# Patient Record
Sex: Male | Born: 1945
Health system: Southern US, Community
[De-identification: ages and names within clinical notes are randomized; demographics above are authoritative.]

## PROBLEM LIST (undated history)

## (undated) DIAGNOSIS — I723 Aneurysm of iliac artery: Secondary | ICD-10-CM

## (undated) DIAGNOSIS — I10 Essential (primary) hypertension: Secondary | ICD-10-CM

## (undated) DIAGNOSIS — M1990 Unspecified osteoarthritis, unspecified site: Secondary | ICD-10-CM

## (undated) HISTORY — DX: Aneurysm of iliac artery: I72.3

## (undated) HISTORY — PX: WISDOM TOOTH EXTRACTION: SHX21

## (undated) HISTORY — PX: JOINT REPLACEMENT: SHX530

## (undated) HISTORY — PX: HAND SURGERY: SHX662

---

## 2000-07-16 ENCOUNTER — Ambulatory Visit (HOSPITAL_COMMUNITY): Admission: RE | Admit: 2000-07-16 | Discharge: 2000-07-16 | Payer: Self-pay | Admitting: Family Medicine

## 2000-09-11 ENCOUNTER — Ambulatory Visit (HOSPITAL_COMMUNITY): Admission: RE | Admit: 2000-09-11 | Discharge: 2000-09-11 | Payer: Self-pay | Admitting: Gastroenterology

## 2007-01-04 ENCOUNTER — Inpatient Hospital Stay (HOSPITAL_COMMUNITY): Admission: RE | Admit: 2007-01-04 | Discharge: 2007-01-06 | Payer: Self-pay | Admitting: Orthopedic Surgery

## 2007-09-27 ENCOUNTER — Inpatient Hospital Stay (HOSPITAL_COMMUNITY): Admission: RE | Admit: 2007-09-27 | Discharge: 2007-09-29 | Payer: Self-pay | Admitting: Orthopedic Surgery

## 2010-12-03 NOTE — H&P (Signed)
Michael Osborne, Michael Osborne                 ACCOUNT NO.:  [id number]   MEDICAL RECORD NO.:  [id number]          PATIENT TYPE:  INP   LOCATION:  NA                           FACILITY:  [facility]   PHYSICIAN:  Richardean Canal, P.A.    DATE OF BIRTH:  01-Jan-1946   DATE OF ADMISSION:  01/04/2007  DATE OF DISCHARGE:                              HISTORY & PHYSICAL   CHIEF COMPLAINT:  Left knee pain.   HISTORY OF PRESENT ILLNESS:  Michael Osborne is a 65 year old male with left  knee pain.  This began became progressively worse over the past few  years.  History of left knee scope June 19, 2000 by Dr. Rennis Chris.  The  patient now with painful popping, giving way sensation in knee.  He uses  no assistive devices to ambulate.  Does use a knee brace.  He has waking  pain.  Left knee pain without radiation described as throbbing pain with  some sharp pains.  He has had cortisone injections in the past with no  real relief.   ALLERGIES:  NO KNOWN DRUG ALLERGIES.   MEDICATIONS:  1. Benicar 40 mg one t.i.d. q.a.m.  2. Caduet 5/20 mg one p.o. q.a.m.  3. Metoprolol 25 mg one p.o. q.a.m.   PAST MEDICAL HISTORY:  Positive for hypertension, history of gout,  remote history of left shoulder dislocation.   PAST SURGICAL HISTORY:  1. March 19, 1995 right knee scope.  2. Dec 11, 1995 right knee.  3. June 19, 2000 left knee scope.  No complications of the above      procedures.  Also noted the patient had ORIF right thumb. No blood      transfusions with the above procedures.   SOCIAL HISTORY:  Patient has a remote history of smoking, quit smoking  in 1997.  He smoked for a total of 25 years.  He lives in a [place]  home with two steps to the regular entrance.   FAMILY HISTORY:  The patient's mother, age 79, has a history of MI,  otherwise healthy.  Father living, is age 43, has history of heart  disease, diabetes, strokes.  Has one brother age 65, has  hypercholesterolemia.   REVIEW OF SYSTEMS:  Reveals  the patient wears glasses for reading.  He  has hypertension, history of gout, remote history of the left shoulder  dislocation. Stress test 2001-1002, the patient relates as negative.  History of hemorrhoids.  Otherwise, review of systems negative or  noncontributory.   PHYSICAL EXAM:  GENERAL:  The patient is well-developed, well-nourished  male with obvious varus deformity, bow-legged bilaterally.  Walks with  varus gait pattern.  The patient's mood and affect are appropriate.  Talks easily with examiner.  VITAL SIGNS:  Temperature of 98.8, pulse 68, blood pressure 110/78 and  respiratory rate 16.  CARDIAC:  Regular rate and rhythm.  No murmurs, rubs or gallops noted.  CHEST:  Lungs clear to auscultation bilaterally with no wheezing,  rhonchi or rales noted.  HEENT:  Head is normocephalic, atraumatic without frontal maxillary  sinus tenderness to palpation.  Conjunctivae pink.  Sclerae is  nonicteric, PERRLA.  EOMs are intact.  No visible external ear  deformities.  TMs pearly and gray bilaterally.  NOSE:  Nasal septum midline.  Nasal mucosa pink and moist.  Buccal  mucosa is pink, moist with good dentition.  Pharynx without erythema or  exudate.  Tongue and uvula midline.  NECK:  Trachea is midline, no lymphadenopathy.  Carotids 2+ without  bruits.  No tenderness to palpation along the cervical spine.  He has  full range of motion of the cervical spine.  BACK:  No tenderness to palpation along the thoracic lumbar spine.  BREASTS/GENITOURINARY/RECTAL:  Exams all deferred at this time.  NEUROLOGIC:  The patient is alert and oriented x3.  Cranial nerves II-  XII grossly intact.  Lower extremity strength testing 5/5 throughout.  MUSCULOSKELETAL:  Upper extremities.  He has full range of motion of the  upper extremities without pain; however, strength is equal and symmetric  in size and shape.  Radial pulse 2+.  LOWER EXTREMITIES:  He has pain with range of motion of both hips.  LEFT  KNEE:  Positive crepitus, has range of motion with varus deformity  of 5 degrees, no effusion, full extension, flexes to 110 degrees.  Valgus varus stressing reveals no laxity.  RIGHT KNEE:  Positive crepitus with passive range of motion, varus  deformity 2-3 degrees.  Full extension, flexes to 115 degrees.  No  effusion, no edema.  Has tenderness in the medial joint line  bilaterally.  Valgus varus stressing both knees reveals no laxity.   X-RAYS:  X-rays of his left knee shows complete collapse of the medial  compartment with evidence of tri-compartmental osteoarthritis.   IMPRESSION:  1. Severe osteoarthritis, both knees with varus deformities.  2. Hypertension.  3. History of gout.  4. Remote history of left shoulders dislocation.   PLAN:  The patient to be admitted to [facility] on January 03, 2005 to undergo a left total knee arthroplasty.  Prior to surgery, the  patient did receive clearance from Dr. Jarome Matin of [facility].  The patient will undergo all preoperative lab  testing prior to surgery.      Richardean Canal, P.A.     GC/MEDQ  D:  12/31/2006  T:  01/01/2007  Job:  [id number]   cc:   Mila Homer. Sherlean Foot, M.D.

## 2010-12-03 NOTE — Op Note (Signed)
Michael Osborne, Michael Osborne                 ACCOUNT NO.:  [id number]   MEDICAL RECORD NO.:  [id number]          PATIENT TYPE:  INP   LOCATION:  5021                         FACILITY:  [facility]   PHYSICIAN:  Mila Homer. Sherlean Foot, M.D. DATE OF BIRTH:  02/28/1946   DATE OF PROCEDURE:  09/27/2007  DATE OF DISCHARGE:                               OPERATIVE REPORT   SURGEON:  Mila Homer. Sherlean Foot, M.D.   ASSISTANTArlys John D. Petrarca, P.A.-C.   PREOPERATIVE DIAGNOSIS:  Right knee osteoarthritis.   POSTOPERATIVE DIAGNOSIS:  Right knee osteoarthritis.   PROCEDURE:  A right total knee arthroplasty.   INDICATIONS FOR PROCEDURE:  The patient is 65 year old white male with  failure conservative measures for osteoarthritis of the right knee.  Left knee was done a year ago or nine months ago.  Informed consent  obtained.   DESCRIPTION OF PROCEDURE:  The patient was laid supine, administered  general anesthesia and placed in the supine position.  Foley catheter  was placed.  Right leg was prepped and draped in usual sterile fashion.  The extremity was exsanguinated with the Esmarch and tourniquet inflated  to 350 mmHg and set for an hour.  I made a midline incision with a #10  blade.  I used a new blade to make a median parapatellar arthrotomy to  perform a synovectomy.  I then elevated deep MCL off the medial crest of  the tibia and released some medially because he was in pretty  significant varus position preoperatively.  Then I everted the patella.  It measured 25 mm thick.  I reamed down 9 mm, drilled three lug holes  through 32-mm template.  With the trial in place I recreated 25-mm  thickness.  I then removed trial and went into flexion.  I used the  extramedullary system on the tibia to make a perpendicular cut to the  anatomic axis of the tibia.  I then used the intramedullary system on  the femur to make a 6 degree valgus cut on the distal femur done with  sagittal saw through the guide.  Then I  marked out the epicondylar  axis,posterior condylar angle measured about degrees.  I sized to size  F, pinned to 3 degree external placed holes.  I then placed the 4 in 1  cutting block onto the distal femur.  I made the anterior-posterior and  chamfer cuts with sagittal saw.  I then placed the 12-mm spacer block in  the knee.  It was a varus position so I recut in 2 degrees valgus and  corrected that.  I then placed a lamina spreader in the knee, removed  the ACL, PCL, and medial and lateral menisci and posterior condylar  osteophytes.  Then I used the finishing block for a size F on the femur  and tibial tray drilled and keeled to a size 5.  I then trialed with a  size 5 tibia, size F femur, 12 and 14 polys, felt that 14 insert worked  better, that I would have to do a little bit more medial releasing to  obtain  balance.  Patella tracked well.  I removed trial components and  copiously irrigated.  I then cemented in the components and removed  excess cement and allowed the cement to harden in extension.  I placed a  Hemovac coming out superolaterally and deep to the arthrotomy, pain  catheter coming out supermedial and superficial to arthrotomy.  I let  tourniquet down at 58 minutes and obtained hemostasis, copiously  irrigated again.  I then closed the arthrotomy with figure-of-eight #1  Vicryl sutures, deep soft tissues buried 0 Vicryl sutures and a  subcuticular 2-0 Vicryl stitch and skin staples.  Dressed with Xeroform  dressing sponges one layer of Webril and TED stocking.   COMPLICATIONS:  None.   DRAINS:  One Hemovac, one pain catheter.   ESTIMATED BLOOD LOSS:  300 mL.   TOURNIQUET TIME:  58 minutes.           ______________________________  Mila Homer Sherlean Foot, M.D.     SDL/MEDQ  D:  09/27/2007  T:  09/28/2007  Job:  [id number]

## 2010-12-03 NOTE — Op Note (Signed)
Michael, Osborne                 ACCOUNT NO.:  [id number]   MEDICAL RECORD NO.:  [id number]          PATIENT TYPE:  INP   LOCATION:  2861                         FACILITY:  [facility]   PHYSICIAN:  Mila Homer. Sherlean Foot, M.D. DATE OF BIRTH:  [birth year]   DATE OF PROCEDURE:  01/04/2007  DATE OF DISCHARGE:                               OPERATIVE REPORT   SURGEON:  Mila Homer. Sherlean Foot, M.D.   ASSISTANT:  Rexene Edison, PA.   ANESTHESIA:  General.   PREOPERATIVE DIAGNOSIS:  Left knee osteoarthritis.   POSTOPERATIVE DIAGNOSIS:  Left knee osteoarthritis.   PROCEDURE:  Left total knee arthroplasty.   INDICATIONS FOR PROCEDURE:  The patient is a 65 year old white male with  failure of conservative measures for osteoarthritis of the left knee.  Informed consent was obtained.   DESCRIPTION OF PROCEDURE:  The patient was laid supine and general  anesthesia.  Left leg was prepped and draped in usual sterile fashion.  The leg was then exsanguinated with the Esmarch and the tourniquet  inflated to 350 mmHg and set for an hour.  Then a #10 blade used to make  a midline incision.  Used a separate blade to make a medial parapatellar  arthrotomy and performed synovectomy.  I then elevated the deep MCL off  the medial crest of the tibia around to and through the semimembranosus  attachment.  I then everted the patella and measured 24 mm thick.  I  reamed down 9 mm and drilled three lug holes and with a 32 mm trial in  place, recreated to 24 mm thickness.  I then removed the trial  component.  I went into flexion.  I cut the ACL and PCL off the  attachment notch, and then subluxed it through anteriorly.  I used the  extramedullary alignment system to make a perpendicular cut to the  anatomic axis of the tibia.  I then removed external alignment system on  the cut surface of the bone.  I went to the intramedullary canal of the  femur with an intramedullary drill.  I used the intramedullary guide set  on 60  valgus cut, made the distal femoral cut with sagittal saw.  I then  sized to size E, pinned it in 3 degrees of external rotation and made  the anterior, posterior and chamfer cuts with a 4-in-1 cutting block.  I  then placed a lamina spreader in the knee, removed all the cut surfaces  of the femur and the ACL, PCL, medial and lateral menisci and posterior  condylar osteophytes.  I then obtained flexion/extension gap balance  with a 12 mm spacer block.  I then placed a scoop retractor behind the  femur and finished the femur with a size E finishing block, cutting for  box and drilling for the lugs.  I finished the tibia with a size 5  tibial tray drill and  keel.  I then trialed a size 5 tibia, size E  femur, size 12 insert, 32 patella.  I had excellent flexion/extension  gap balance, recreated the normal range of motion from 0-125  degrees and  patellar tracking was excellent.  I then removed the trial components,  copiously irrigated after lavaged copiously.  I then cemented in the  components, removed excess cement allowing the cement to harden in  extension.  I then left the Hemovac coming out superolateral and deep to  the arthrotomy, the  pain catheter coming out supermedial and  superficial to the arthrotomy.  I closed the arthrotomy with figure-of-  eight #1 Vicryl sutures, closed the deep soft tissues with buried 0  Vicryl sutures and then subcuticular 2-0 Vicryl stitch and skin staples.  I then dressed with Xeroform dressing sponges, sterile Webril and two  Ace wrap.   COMPLICATIONS:  None.   DRAINS:  One Hemovac and one pain catheter.   ESTIMATED BLOOD LOSS:  300 mL.   TOURNIQUET TIME:  51 minutes.           ______________________________  Mila Homer Sherlean Foot, M.D.     SDL/MEDQ  D:  01/04/2007  T:  01/04/2007  Job:  [id number]

## 2011-04-14 LAB — BASIC METABOLIC PANEL
CO2: 22
CO2: 25
Calcium: 8.2 — ABNORMAL LOW
Calcium: 8.7
Chloride: 104
Chloride: 106
GFR calc Af Amer: >60
Glucose, Bld: 89
Sodium: 135
Sodium: 138

## 2011-04-14 LAB — URINALYSIS, ROUTINE W REFLEX MICROSCOPIC
Nitrite: NEGATIVE
Protein, ur: NEGATIVE
Urobilinogen, UA: 0.2

## 2011-04-14 LAB — CBC
Hemoglobin: 12.4 — ABNORMAL LOW
Hemoglobin: 12.7 — ABNORMAL LOW
MCHC: 34.3
MCHC: 34.4
MCHC: 35
MCV: 90.6
MCV: 91.2
MCV: 91.5
Platelets: 209
RBC: 3.94 — ABNORMAL LOW
RBC: 3.99 — ABNORMAL LOW
RBC: 5.09
RDW: 12.5
WBC: 15.1 — ABNORMAL HIGH

## 2011-04-14 LAB — PROTIME-INR
INR: 1
Prothrombin Time: 12.9

## 2011-04-14 LAB — COMPREHENSIVE METABOLIC PANEL
AST: 25
CO2: 23
Calcium: 9.4
Creatinine, Ser: 1.05
GFR calc Af Amer: >60
GFR calc non Af Amer: >60

## 2011-04-14 LAB — DIFFERENTIAL
Eosinophils Relative: 3
Lymphocytes Relative: 19
Lymphs Abs: 1.6
Neutrophils Relative %: 69

## 2011-04-14 LAB — URINE CULTURE

## 2011-04-14 LAB — CROSSMATCH

## 2011-04-14 LAB — APTT: aPTT: 26

## 2011-05-07 LAB — BASIC METABOLIC PANEL
BUN: 8
CO2: 26
Calcium: 8.3 — ABNORMAL LOW
Calcium: 8.8
Creatinine, Ser: 0.95
GFR calc Af Amer: >60
GFR calc non Af Amer: >60
GFR calc non Af Amer: >60
Glucose, Bld: 101 — ABNORMAL HIGH
Glucose, Bld: 134 — ABNORMAL HIGH
Potassium: 4.2
Sodium: 134 — ABNORMAL LOW
Sodium: 137

## 2011-05-07 LAB — CBC
HCT: 32.8 — ABNORMAL LOW
Hemoglobin: 11.1 — ABNORMAL LOW
MCHC: 34.6
Platelets: 187
RDW: 12.4
RDW: 12.8
WBC: 9

## 2011-05-08 LAB — COMPREHENSIVE METABOLIC PANEL
ALT: 28
Albumin: 4
Alkaline Phosphatase: 65
Chloride: 107
Potassium: 4.1
Sodium: 139
Total Bilirubin: 0.6
Total Protein: 6.7

## 2011-05-08 LAB — CBC
HCT: 46.4
Hemoglobin: 15.6
Platelets: 254
RDW: 12.7
WBC: 8.5

## 2011-05-08 LAB — TYPE AND SCREEN: Antibody Screen: NEGATIVE

## 2011-05-08 LAB — URINALYSIS, ROUTINE W REFLEX MICROSCOPIC
Bilirubin Urine: NEGATIVE
Glucose, UA: NEGATIVE
Hgb urine dipstick: NEGATIVE
Nitrite: NEGATIVE
Specific Gravity, Urine: 1.03 (ref 1.005–1.035)
pH: 5.5

## 2011-05-08 LAB — DIFFERENTIAL
Basophils Absolute: 0.1
Basophils Relative: 1
Eosinophils Absolute: 0.2
Eosinophils Relative: 2
Monocytes Absolute: 0.8 — ABNORMAL HIGH
Monocytes Relative: 9

## 2011-05-08 LAB — ABO/RH: ABO/RH(D): O POS

## 2011-05-08 LAB — URINE CULTURE: Culture: NO GROWTH

## 2013-03-04 ENCOUNTER — Other Ambulatory Visit (HOSPITAL_COMMUNITY): Payer: Self-pay | Admitting: Internal Medicine

## 2013-03-04 DIAGNOSIS — I700 Atherosclerosis of aorta: Secondary | ICD-10-CM

## 2013-03-07 ENCOUNTER — Ambulatory Visit (HOSPITAL_COMMUNITY)
Admission: RE | Admit: 2013-03-07 | Discharge: 2013-03-07 | Disposition: A | Discharge status: Home / Self Care | Payer: PRIVATE HEALTH INSURANCE | Source: Ambulatory Visit | Attending: Internal Medicine | Admitting: Internal Medicine

## 2013-03-07 DIAGNOSIS — I700 Atherosclerosis of aorta: Secondary | ICD-10-CM | POA: Insufficient documentation

## 2015-10-08 ENCOUNTER — Other Ambulatory Visit: Payer: Self-pay | Admitting: Orthopedic Surgery

## 2015-10-11 ENCOUNTER — Encounter (HOSPITAL_BASED_OUTPATIENT_CLINIC_OR_DEPARTMENT_OTHER): Payer: Self-pay | Admitting: *Deleted

## 2015-10-12 ENCOUNTER — Encounter (HOSPITAL_BASED_OUTPATIENT_CLINIC_OR_DEPARTMENT_OTHER)
Admission: RE | Admit: 2015-10-12 | Discharge: 2015-10-12 | Disposition: A | Discharge status: Home / Self Care | Payer: PRIVATE HEALTH INSURANCE | Source: Ambulatory Visit | Attending: Orthopedic Surgery | Admitting: Orthopedic Surgery

## 2015-10-12 ENCOUNTER — Other Ambulatory Visit: Payer: Self-pay

## 2015-10-12 DIAGNOSIS — I10 Essential (primary) hypertension: Secondary | ICD-10-CM | POA: Diagnosis not present

## 2015-10-12 DIAGNOSIS — Z01810 Encounter for preprocedural cardiovascular examination: Secondary | ICD-10-CM | POA: Diagnosis present

## 2015-10-18 ENCOUNTER — Ambulatory Visit (HOSPITAL_BASED_OUTPATIENT_CLINIC_OR_DEPARTMENT_OTHER)
Admission: RE | Admit: 2015-10-18 | Discharge: 2015-10-18 | Disposition: A | Discharge status: Home / Self Care | Payer: PRIVATE HEALTH INSURANCE | Source: Ambulatory Visit | Attending: Orthopedic Surgery | Admitting: Orthopedic Surgery

## 2015-10-18 ENCOUNTER — Encounter (HOSPITAL_BASED_OUTPATIENT_CLINIC_OR_DEPARTMENT_OTHER)
Admission: RE | Disposition: A | Discharge status: Home / Self Care | Payer: Self-pay | Source: Ambulatory Visit | Attending: Orthopedic Surgery

## 2015-10-18 ENCOUNTER — Ambulatory Visit (HOSPITAL_BASED_OUTPATIENT_CLINIC_OR_DEPARTMENT_OTHER): Payer: PRIVATE HEALTH INSURANCE | Admitting: Anesthesiology

## 2015-10-18 ENCOUNTER — Encounter (HOSPITAL_BASED_OUTPATIENT_CLINIC_OR_DEPARTMENT_OTHER): Payer: Self-pay | Admitting: *Deleted

## 2015-10-18 DIAGNOSIS — M19041 Primary osteoarthritis, right hand: Secondary | ICD-10-CM | POA: Insufficient documentation

## 2015-10-18 DIAGNOSIS — Z96653 Presence of artificial knee joint, bilateral: Secondary | ICD-10-CM | POA: Insufficient documentation

## 2015-10-18 DIAGNOSIS — I10 Essential (primary) hypertension: Secondary | ICD-10-CM | POA: Insufficient documentation

## 2015-10-18 DIAGNOSIS — Z79899 Other long term (current) drug therapy: Secondary | ICD-10-CM | POA: Insufficient documentation

## 2015-10-18 DIAGNOSIS — M109 Gout, unspecified: Secondary | ICD-10-CM | POA: Diagnosis not present

## 2015-10-18 HISTORY — DX: Essential (primary) hypertension: I10

## 2015-10-18 HISTORY — DX: Unspecified osteoarthritis, unspecified site: M19.90

## 2015-10-18 HISTORY — PX: FINGER ARTHROPLASTY: SHX5017

## 2015-10-18 SURGERY — ARTHROPLASTY, FINGER
Anesthesia: Regional | Site: Finger | Laterality: Right

## 2015-10-18 MED ORDER — MIDAZOLAM HCL 2 MG/2ML IJ SOLN
1.0000 mg | INTRAMUSCULAR | Status: DC | PRN
  Administered 2015-10-18: 1 mg via INTRAVENOUS

## 2015-10-18 MED ORDER — CEFAZOLIN SODIUM-DEXTROSE 2-4 GM/100ML-% IV SOLN
INTRAVENOUS | Status: AC
  Filled 2015-10-18: qty 100

## 2015-10-18 MED ORDER — DEXAMETHASONE SODIUM PHOSPHATE 10 MG/ML IJ SOLN
INTRAMUSCULAR | Status: AC
  Filled 2015-10-18: qty 1

## 2015-10-18 MED ORDER — HYDROCODONE-ACETAMINOPHEN 7.5-325 MG PO TABS: 1.0000 | ORAL_TABLET | Freq: Once | ORAL | Status: DC | PRN

## 2015-10-18 MED ORDER — DEXAMETHASONE SODIUM PHOSPHATE 10 MG/ML IJ SOLN
INTRAMUSCULAR | Status: DC | PRN
  Administered 2015-10-18: 8 mg via INTRAVENOUS

## 2015-10-18 MED ORDER — CEFAZOLIN SODIUM 1 G IJ SOLR
2.0000 g | INTRAMUSCULAR | Status: AC
  Administered 2015-10-18: 2 g via INTRAVENOUS

## 2015-10-18 MED ORDER — FENTANYL CITRATE (PF) 100 MCG/2ML IJ SOLN
INTRAMUSCULAR | Status: AC
  Filled 2015-10-18: qty 2

## 2015-10-18 MED ORDER — PROPOFOL 10 MG/ML IV BOLUS
INTRAVENOUS | Status: DC | PRN
  Administered 2015-10-18: 200 mg via INTRAVENOUS

## 2015-10-18 MED ORDER — PROPOFOL 500 MG/50ML IV EMUL
INTRAVENOUS | Status: AC
  Filled 2015-10-18: qty 50

## 2015-10-18 MED ORDER — OXYCODONE-ACETAMINOPHEN 7.5-325 MG PO TABS
1.0000 | ORAL_TABLET | ORAL | Status: DC | PRN
Start: 2015-10-18 — End: 2017-12-09

## 2015-10-18 MED ORDER — LIDOCAINE HCL (CARDIAC) 20 MG/ML IV SOLN
INTRAVENOUS | Status: AC
  Filled 2015-10-18: qty 5

## 2015-10-18 MED ORDER — PROMETHAZINE HCL 25 MG/ML IJ SOLN: 6.2500 mg | INTRAMUSCULAR | Status: DC | PRN

## 2015-10-18 MED ORDER — EPHEDRINE SULFATE 50 MG/ML IJ SOLN
INTRAMUSCULAR | Status: DC | PRN
  Administered 2015-10-18 (×2): 10 mg via INTRAVENOUS

## 2015-10-18 MED ORDER — ONDANSETRON HCL 4 MG/2ML IJ SOLN
INTRAMUSCULAR | Status: AC
  Filled 2015-10-18: qty 2

## 2015-10-18 MED ORDER — SCOPOLAMINE 1 MG/3DAYS TD PT72: 1.0000 | MEDICATED_PATCH | Freq: Once | TRANSDERMAL | Status: DC | PRN

## 2015-10-18 MED ORDER — CHLORHEXIDINE GLUCONATE 4 % EX LIQD: 60.0000 mL | Freq: Once | CUTANEOUS | Status: DC

## 2015-10-18 MED ORDER — GLYCOPYRROLATE 0.2 MG/ML IJ SOLN: 0.2000 mg | Freq: Once | INTRAMUSCULAR | Status: DC | PRN

## 2015-10-18 MED ORDER — FENTANYL CITRATE (PF) 100 MCG/2ML IJ SOLN
50.0000 ug | INTRAMUSCULAR | Status: DC | PRN
  Administered 2015-10-18: 50 ug via INTRAVENOUS

## 2015-10-18 MED ORDER — BUPIVACAINE-EPINEPHRINE (PF) 0.5% -1:200000 IJ SOLN
INTRAMUSCULAR | Status: DC | PRN
  Administered 2015-10-18: 30 mL via PERINEURAL

## 2015-10-18 MED ORDER — MIDAZOLAM HCL 2 MG/2ML IJ SOLN
INTRAMUSCULAR | Status: AC
  Filled 2015-10-18: qty 2

## 2015-10-18 MED ORDER — HYDROMORPHONE HCL 1 MG/ML IJ SOLN
0.2500 mg | INTRAMUSCULAR | Status: DC | PRN
  Administered 2015-10-18 (×2): 0.5 mg via INTRAVENOUS

## 2015-10-18 MED ORDER — HYDROMORPHONE HCL 1 MG/ML IJ SOLN
INTRAMUSCULAR | Status: AC
  Filled 2015-10-18: qty 1

## 2015-10-18 MED ORDER — LACTATED RINGERS IV SOLN
INTRAVENOUS | Status: DC
  Administered 2015-10-18: 10:00:00 via INTRAVENOUS

## 2015-10-18 SURGICAL SUPPLY — 56 items
BLADE MINI RND TIP GREEN BEAV (BLADE) ×2 IMPLANT
BLADE OSC/SAG .038X5.5 CUT EDG (BLADE) ×1 IMPLANT
BLADE SURG 15 STRL LF DISP TIS (BLADE) ×1 IMPLANT
BLADE SURG 15 STRL SS (BLADE) ×2
BNDG CMPR 9X4 STRL LF SNTH (GAUZE/BANDAGES/DRESSINGS) ×1
BNDG COHESIVE 3X5 TAN STRL LF (GAUZE/BANDAGES/DRESSINGS) ×2 IMPLANT
BNDG ESMARK 4X9 LF (GAUZE/BANDAGES/DRESSINGS) ×2 IMPLANT
BNDG GAUZE ELAST 4 BULKY (GAUZE/BANDAGES/DRESSINGS) ×2 IMPLANT
BUR FAST CUTTING MED (BURR) IMPLANT
CHLORAPREP W/TINT 26ML (MISCELLANEOUS) ×2 IMPLANT
CORDS BIPOLAR (ELECTRODE) ×2 IMPLANT
COVER BACK TABLE 60X90IN (DRAPES) ×2 IMPLANT
COVER MAYO STAND STRL (DRAPES) ×2 IMPLANT
CUFF TOURNIQUET SINGLE 18IN (TOURNIQUET CUFF) ×1 IMPLANT
DECANTER SPIKE VIAL GLASS SM (MISCELLANEOUS) IMPLANT
DRAPE EXTREMITY T 121X128X90 (DRAPE) ×2 IMPLANT
DRAPE OEC MINIVIEW 54X84 (DRAPES) ×2 IMPLANT
DRAPE SURG 17X23 STRL (DRAPES) ×2 IMPLANT
GAUZE SPONGE 4X4 12PLY STRL (GAUZE/BANDAGES/DRESSINGS) ×2 IMPLANT
GAUZE XEROFORM 1X8 LF (GAUZE/BANDAGES/DRESSINGS) ×2 IMPLANT
GLOVE BIO SURGEON STRL SZ 6.5 (GLOVE) ×1 IMPLANT
GLOVE BIO SURGEON STRL SZ7.5 (GLOVE) ×1 IMPLANT
GLOVE BIOGEL PI IND STRL 7.0 (GLOVE) IMPLANT
GLOVE BIOGEL PI IND STRL 8 (GLOVE) IMPLANT
GLOVE BIOGEL PI IND STRL 8.5 (GLOVE) ×1 IMPLANT
GLOVE BIOGEL PI INDICATOR 7.0 (GLOVE) ×2
GLOVE BIOGEL PI INDICATOR 8 (GLOVE) ×1
GLOVE BIOGEL PI INDICATOR 8.5 (GLOVE) ×1
GLOVE SURG ORTHO 8.0 STRL STRW (GLOVE) ×2 IMPLANT
GLOVE SURG SS PI 7.0 STRL IVOR (GLOVE) ×1 IMPLANT
GOWN STRL REUS W/ TWL LRG LVL3 (GOWN DISPOSABLE) ×1 IMPLANT
GOWN STRL REUS W/TWL LRG LVL3 (GOWN DISPOSABLE) ×4
GOWN STRL REUS W/TWL XL LVL3 (GOWN DISPOSABLE) ×3 IMPLANT
IMPL SILICONE MCP SZ20 (Joint) IMPLANT
IMPLANT SILICONE MCP SZ20 (Joint) ×2 IMPLANT
K-WIRE PROS .028 4 (WIRE) ×1 IMPLANT
LOOP VESSEL MAXI BLUE (MISCELLANEOUS) ×1 IMPLANT
NS IRRIG 1000ML POUR BTL (IV SOLUTION) ×2 IMPLANT
PACK BASIN DAY SURGERY FS (CUSTOM PROCEDURE TRAY) ×2 IMPLANT
PACK MCP STRYKER DISPOSABLE (KITS) ×1 IMPLANT
PAD CAST 3X4 CTTN HI CHSV (CAST SUPPLIES) ×1 IMPLANT
PADDING CAST ABS 3INX4YD NS (CAST SUPPLIES)
PADDING CAST ABS 4INX4YD NS (CAST SUPPLIES) ×1
PADDING CAST ABS COTTON 3X4 (CAST SUPPLIES) IMPLANT
PADDING CAST ABS COTTON 4X4 ST (CAST SUPPLIES) ×1 IMPLANT
PADDING CAST COTTON 3X4 STRL (CAST SUPPLIES) ×2
SLEEVE SCD COMPRESS KNEE MED (MISCELLANEOUS) ×1 IMPLANT
SPLINT PLASTER CAST XFAST 3X15 (CAST SUPPLIES) IMPLANT
SPLINT PLASTER XTRA FASTSET 3X (CAST SUPPLIES)
STOCKINETTE 4X48 STRL (DRAPES) ×2 IMPLANT
SUT ETHILON 4 0 PS 2 18 (SUTURE) ×2 IMPLANT
SUT VICRYL 4-0 PS2 18IN ABS (SUTURE) ×2 IMPLANT
SYR BULB 3OZ (MISCELLANEOUS) ×2 IMPLANT
SYR CONTROL 10ML LL (SYRINGE) IMPLANT
TOWEL OR 17X24 6PK STRL BLUE (TOWEL DISPOSABLE) ×2 IMPLANT
UNDERPAD 30X30 (UNDERPADS AND DIAPERS) ×2 IMPLANT

## 2015-10-18 NOTE — Op Note (Signed)
NAMETREYVIAN, DONAGHEY                 ACCOUNT NO.:  [id number]  MEDICAL RECORD NO.:  [id number]  LOCATION:                                 FACILITY:  PHYSICIAN:  Daryll Brod, M.D.            DATE OF BIRTH:  DATE OF PROCEDURE:  10/18/2015 DATE OF DISCHARGE:                              OPERATIVE REPORT   PREOPERATIVE DIAGNOSIS:  Degenerative arthritis, metacarpophalangeal joint, right middle finger.  POSTOPERATIVE DIAGNOSIS:  Degenerative arthritis, metacarpophalangeal joint, right middle finger.  OPERATION:  Silastic interposition arthroplasty, metacarpophalangeal joint, right middle finger with reconstruction of radial collateral ligament.  SURGEON:  Daryll Brod, M.D.  ASSISTANT:  Leanora Cover, MD  ANESTHESIA:  Supraclavicular block, general.  ANESTHESIOLOGIST:  Soledad Gerlach, MD.  PLACE OF SURGERY:  [facility].  HISTORY:  The patient is a 70 year old male with a history of pain to the metacarpophalangeal joint of his right middle finger.  This has not responded to conservative treatment including multiple injections.  He is admitted and requested to have this replaced with an arthroplasty. Pre, peri, and postoperative course have been discussed along with risks and complications.  He is aware that there is no guarantee with the surgery; possibility of infection; recurrence of injury to arteries, nerves, tendons; incomplete relief of symptoms; dystrophy.  In the preoperative area, the patient is seen, the extremity marked by both patient and surgeon, and antibiotic given.  DESCRIPTION OF PROCEDURE:  The patient was brought to the operating room where a general anesthetic was carried out.  A supraclavicular block carried out in the preoperative area.  He was prepped using ChloraPrep, supine position, right arm free.  A 3-minute dry time was allowed, time- out taken, confirming the patient and procedure.  The limb was exsanguinated with an Esmarch bandage.   Tourniquet placed high on the arm was inflated to 250 mmHg.  A longitudinal incision was made over the metacarpophalangeal joint, right middle finger dorsally, carried down through the subcutaneous tissue.  Bleeders were electrocauterized with bipolar.  The dissection carried to the ulnar aspect of the central tendon.  The sagittal fiber was incised on the ulnar side, the joint capsule was then incised, this was found to be markedly thickened. Total degeneration of the metacarpophalangeal joint articular surface, proximally and distally had occurred.  The collateral ligaments were incised and protected both radially and ulnarly.  An oscillating saw was then used to remove the distal aspect of the metacarpal head just proximal to the articular surface and just distal to the insertion of the collateral ligaments.  The joint was opened.  The proximal phalanx, which addressed first, a drill hole placed and all then used to enlarge this.  Then with curettes and broaches, this was enlarged to a #20 AcuMed prospect silastic replacement distally.  This was confirmed with x-rays, that broached and lied in good position.  The proximal metacarpal was then addressed next.  A trephine was used to open the canal, this was enlarged with broaches to a size 20.  This was then copiously irrigated with saline.  A 20 trial prosthesis was placed and lied in good  position.  X-rays in AP, lateral, oblique direction revealed that it lied in good position.  This was removed, the wound again irrigated.  Drill holes were then placed in metacarpal neck for reconstruction of the collateral ligament radially.  A 4-0 Mersilene was then passed through the drill holes and used to anchor the metacarpal radial collateral ligament in position.  This was not tied over the bone at this point in time.  The 2-0 prosthesis was then inserted, first proximally, then distally.  The finger placed through full flexion and extension.   X-rays in AP, lateral and oblique direction confirmed that the prosthesis lied in good position.  The collateral ligament was then repaired.  The capsule closed with running 4-0 Vicryl suture and the extensor tendon was repaired with a running 4-0 Mersilene suture and the skin with interrupted 4-0 nylon sutures.  A sterile compressive dorsal splint was applied.  On deflation of the tourniquet, all fingers were immediately pinked.  He was taken to the recovery room for observation in satisfactory condition.  He will be discharged to home to return to the [place] in 1 week, on Percocet.          ______________________________ Daryll Brod, M.D.     GK/MEDQ  D:  10/18/2015  T:  10/18/2015  Job:  [id number]

## 2015-10-18 NOTE — Progress Notes (Signed)
Assisted Dr. Rob Fitzgerald with right, ultrasound guided, axillary block. Side rails up, monitors on throughout procedure. See vital signs in flow sheet. Tolerated Procedure well. 

## 2015-10-18 NOTE — Transfer of Care (Signed)
Immediate Anesthesia Transfer of Care Note  Patient: Michael Osborne  Procedure(s) Performed: Procedure(s): SILASTIC  ARTHROPLASTY RIGHT MIDDLE FINGER RECONSTRUCTION COLLATERAL LIGAMENT (Right)  Patient Location: PACU  Anesthesia Type:GA combined with regional for post-op pain  Level of Consciousness: awake  Airway & Oxygen Therapy: Patient Spontanous Breathing and Patient connected to face mask oxygen  Post-op Assessment: Report given to RN and Post -op Vital signs reviewed and stable  Post vital signs: Reviewed and stable  Last Vitals:  Filed Vitals:   10/18/15 0955 10/18/15 0956  BP:    Pulse: 69 70  Temp:    Resp: 15 13    Complications: No apparent anesthesia complications

## 2015-10-18 NOTE — Op Note (Signed)
I assisted Surgeon(s) and Role:    * Daryll Brod, MD - Primary    * Leanora Cover, MD - Assisting on the Procedure(s): SILASTIC  ARTHROPLASTY RIGHT MIDDLE [place] on 10/18/2015.  I provided assistance on this case as follows: retraction of soft tissues for visualization, assistance with broaching of the canal, and placement of implant.  Electronically signed by: Tennis Must, MD Date: 10/18/2015 Time: 11:29 AM

## 2015-10-18 NOTE — Anesthesia Postprocedure Evaluation (Signed)
Anesthesia Post Note  Patient: Michael Osborne  Procedure(s) Performed: Procedure(s) (LRB): SILASTIC  ARTHROPLASTY RIGHT MIDDLE FINGER RECONSTRUCTION COLLATERAL LIGAMENT (Right)  Patient location during evaluation: PACU Anesthesia Type: General Level of consciousness: awake and alert and patient cooperative Pain management: pain level controlled Vital Signs Assessment: post-procedure vital signs reviewed and stable Respiratory status: spontaneous breathing and respiratory function stable Cardiovascular status: stable Anesthetic complications: no    Last Vitals:  Filed Vitals:   10/18/15 1228 10/18/15 1330  BP: 122/80 126/73  Pulse: 69 66  Temp:  36.4 C  Resp: 14 16    Last Pain:  Filed Vitals:   10/18/15 1347  PainSc: [name]

## 2015-10-18 NOTE — Anesthesia Preprocedure Evaluation (Addendum)
Anesthesia Evaluation  Patient identified by MRN, date of birth, ID band Patient awake    Reviewed: Allergy & Precautions, NPO status , Patient's Chart, lab work & pertinent test results, reviewed documented beta blocker date and time   Airway Mallampati: II  TM Distance: >3 FB Neck ROM: Full    Dental   Pulmonary neg pulmonary ROS,    breath sounds clear to auscultation       Cardiovascular hypertension, Pt. on medications and Pt. on home beta blockers  Rhythm:Regular Rate:Normal     Neuro/Psych negative neurological ROS     GI/Hepatic negative GI ROS, Neg liver ROS,   Endo/Other  negative endocrine ROS  Renal/GU negative Renal ROS     Musculoskeletal  (+) Arthritis ,   Abdominal   Peds  Hematology negative hematology ROS (+)   Anesthesia Other Findings   Reproductive/Obstetrics                            Anesthesia Physical Anesthesia Plan  ASA: II  Anesthesia Plan: General and Regional   Post-op Pain Management: GA combined w/ Regional for post-op pain   Induction: Intravenous  Airway Management Planned: LMA  Additional Equipment:   Intra-op Plan:   Post-operative Plan: Extubation in OR  Informed Consent: I have reviewed the patients History and Physical, chart, labs and discussed the procedure including the risks, benefits and alternatives for the proposed anesthesia with the patient or authorized representative who has indicated his/her understanding and acceptance.   Dental advisory given  Plan Discussed with: CRNA  Anesthesia Plan Comments:         Anesthesia Quick Evaluation

## 2015-10-18 NOTE — H&P (Signed)
Michael Osborne is an 70 y.o. male.   Chief Complaint: pain mcp right middle finger HPI: Michael Osborne is a 70 year old left hand dominant male who comes in complaining of pain and swelling of his right middle finger metacarpophalangeal joint. He recalls no specific history of injury. He states two to three weeks ago this began swelling. He did have a great deal of shoveling with a snowplow and this seems to have aggravated this. He has tried taking Aleve two a day without significant relief. He has a history of gout. He has not had treatment for this. He states that he was lifting a battery yesterday, which seemed to have aggravated it. His visual analog pain score is 6-7/10. This a dull aching pain with feeling of swelling at the metacarpophalangeal joint. He has no history of diabetes, thyroid problems. He has had two flares of gout. There is family history of diabetes and arthritis. He has been tested for diabetes by Dr. Sharlett Osborne. He is not taking anything for this. He has not had problems in the past.  Past Medical History  Diagnosis Date  . Hypertension   Past Surgical History  Procedure Laterality Date  . Joint replacement    Past Medical History  Diagnosis Date  . Hypertension   . Arthritis     Past Surgical History  Procedure Laterality Date  . Joint replacement      bl knees  . Hand surgery    . Wisdom tooth extraction      History reviewed. No pertinent family history. Social History:  reports that he has never smoked. He has never used smokeless tobacco. He reports that he drinks alcohol. He reports that he does not use illicit drugs.  Allergies: No Known Allergies  Medications Prior to Admission  Medication Sig Dispense Refill  . amlodipine-atorvastatin (CADUET) 10-20 MG tablet Take 1 tablet by mouth daily.    Marland Kitchen losartan (COZAAR) 100 MG tablet Take 100 mg by mouth daily.    . metoprolol succinate (TOPROL XL) 50 MG 24 hr tablet Take 50 mg by mouth daily. Take with or  immediately following a meal.      No results found for this or any previous visit (from the past 48 hour(s)).  No results found.   Pertinent items are noted in HPI.  Blood pressure 157/91, pulse 71, temperature 98.1 F (36.7 C), temperature source Oral, resp. rate 18, height 5\' 10"  (1.778 m), weight 98.884 kg (218 lb), SpO2 100 %.  General appearance: alert, cooperative and appears stated age Head: Normocephalic, without obvious abnormality Neck: no JVD Resp: clear to auscultation bilaterally Cardio: regular rate and rhythm, S1, S2 normal, no murmur, click, rub or gallop GI: soft, non-tender; bowel sounds normal; no masses,  no organomegaly Extremities: pain and swelling MCP right middle finger Pulses: 2+ and symmetric Skin: Skin color, texture, turgor normal. No rashes or lesions Neurologic: Grossly normal Incision/Wound: na  Assessment/Plan Diagnosis: Degenerative arthritis, right middle finger metacarpophalangeal joint.  PLAN: He states that he would like to proceed to have this corrected with replacement. He shows excellent mobility at the present time of approximately 75 degrees. He is advised that this will not be normal for him, that he will probably lose mobility, that we are trying to deal with the pain. He is advised that the implant did fail, that there is always potential for infection, injury to arteries, nerves and tendons, that if the implant breaks that it may not need to be replaced. He states  that he has had continued increasing pain. Is desirous of proceeding to have the metacarpophalangeal joint replaced. Questions are encouraged and answered for him. He is scheduled for Zylast replacement of metacarpophalangeal joint, right middle finger as an outpatient under regional or general anesthesia.   Michael Osborne R 10/18/2015, 9:43 AM

## 2015-10-18 NOTE — Brief Op Note (Signed)
10/18/2015  11:32 AM  PATIENT:  Michael Osborne  70 y.o. male  PRE-OPERATIVE DIAGNOSIS:  Degenerative Joint Disease Metacarpal Phalangeal Right Middle Finger  M19.041  POST-OPERATIVE DIAGNOSIS:  Degenerative Joint Disease Metacarpal Phalangeal Right Middle Finger  M19.041  PROCEDURE:  Procedure(s): SILASTIC  ARTHROPLASTY RIGHT MIDDLE FINGER RECONSTRUCTION COLLATERAL LIGAMENT (Right)  SURGEON:  Surgeon(s) and Role:    * Daryll Brod, MD - Primary    * Leanora Cover, MD - Assisting  PHYSICIAN ASSISTANT:   ASSISTANTS: K Carleena Mires,MD   ANESTHESIA:   regional and general  EBL:  Total I/O In: 1300 [I.V.:1300] Out: 5 [Blood:5]  BLOOD ADMINISTERED:none  DRAINS: none   LOCAL MEDICATIONS USED:  NONE  SPECIMEN:  No Specimen  DISPOSITION OF SPECIMEN:  N/A  COUNTS:  YES  TOURNIQUET:   Total Tourniquet Time Documented: Upper Arm (Right) - 52 minutes Total: Upper Arm (Right) - 52 minutes   DICTATION: .Other Dictation: Dictation Number [id number]  PLAN OF CARE: Discharge to home after PACU  PATIENT DISPOSITION:  PACU - hemodynamically stable.

## 2015-10-18 NOTE — Anesthesia Procedure Notes (Addendum)
Procedure Name: LMA Insertion Performed by: Tawni Millers Pre-anesthesia Checklist: Patient identified, Emergency Drugs available, Suction available and Patient being monitored Patient Re-evaluated:Patient Re-evaluated prior to inductionOxygen Delivery Method: Circle System Utilized Preoxygenation: Pre-oxygenation with 100% oxygen Intubation Type: IV induction Ventilation: Mask ventilation without difficulty LMA: LMA inserted LMA Size: 5.0 Number of attempts: 1 Airway Equipment and Method: Bite block Placement Confirmation: positive ETCO2 Tube secured with: Tape Dental Injury: Teeth and Oropharynx as per pre-operative assessment    Anesthesia Regional Block:  Axillary brachial plexus block  Pre-Anesthetic Checklist: ,, timeout performed, Correct Patient, Correct Site, Correct Laterality, Correct Procedure, Correct Position, site marked, Risks and benefits discussed,  Surgical consent,  Pre-op evaluation,  At surgeon's request and post-op pain management  Laterality: Right  Prep: chloraprep       Needles:   Needle Type: Echogenic Stimulator Needle     Needle Length: 9cm 9 cm Needle Gauge: 21 and 21 G    Additional Needles:  Procedures: ultrasound guided (picture in chart) and nerve stimulator Axillary brachial plexus block  Nerve Stimulator or Paresthesia:  Response: radial, 0.5 mA,  Response: median, 0.5 mA,  Response: ulnar, 0.5 mA,   Additional Responses:   Narrative:  Start time: 10/18/2015 9:45 AM End time: 10/18/2015 9:52 AM Injection made incrementally with aspirations every 5 mL.  Performed by: Personally  Anesthesiologist: Suzette Battiest

## 2015-10-18 NOTE — Discharge Instructions (Addendum)

## 2015-10-18 NOTE — Op Note (Signed)
Dictation Number [id number] Intra-operative fluoroscopic images in the AP, lateral, and oblique views were taken and evaluated by myself.  Reduction and prosthesis placement were confirmed in the metacarpal phalngeal joint right middle finger.

## 2015-10-19 ENCOUNTER — Encounter (HOSPITAL_BASED_OUTPATIENT_CLINIC_OR_DEPARTMENT_OTHER): Payer: Self-pay | Admitting: Orthopedic Surgery

## 2016-11-17 ENCOUNTER — Other Ambulatory Visit: Payer: Self-pay | Admitting: Internal Medicine

## 2016-11-17 DIAGNOSIS — I714 Abdominal aortic aneurysm, without rupture, unspecified: Secondary | ICD-10-CM

## 2016-11-25 ENCOUNTER — Ambulatory Visit
Admission: RE | Admit: 2016-11-25 | Discharge: 2016-11-25 | Disposition: A | Discharge status: Home / Self Care | Payer: PRIVATE HEALTH INSURANCE | Source: Ambulatory Visit | Attending: Internal Medicine | Admitting: Internal Medicine

## 2016-11-25 DIAGNOSIS — I714 Abdominal aortic aneurysm, without rupture, unspecified: Secondary | ICD-10-CM

## 2016-12-22 ENCOUNTER — Encounter: Payer: Self-pay | Admitting: Vascular Surgery

## 2016-12-24 ENCOUNTER — Encounter: Payer: Self-pay | Admitting: Vascular Surgery

## 2016-12-24 ENCOUNTER — Ambulatory Visit (INDEPENDENT_AMBULATORY_CARE_PROVIDER_SITE_OTHER): Payer: PRIVATE HEALTH INSURANCE | Admitting: Vascular Surgery

## 2016-12-24 VITALS — BP 128/88 | HR 97 | Temp 98.0°F | Resp 18 | Ht 70.0 in | Wt 214.0 lb

## 2016-12-24 DIAGNOSIS — I714 Abdominal aortic aneurysm, without rupture, unspecified: Secondary | ICD-10-CM

## 2016-12-24 NOTE — Progress Notes (Signed)
Referring Physician: Valetta Fuller MD  Patient name: Michael Osborne MRN: [id number] DOB: [birth year] Sex: male  REASON FOR CONSULT: abdominal aortic aneurysm  HPI: Michael Osborne is a 71 y.o. male with history of abdominal aortic aneurysm at least since 2014. This was found on ultrasound. He denies any abdominal or back pain. He has no family history of abdominal aortic aneurysm. He has had no prior abdominal procedures. Other medical problems include arthritis and hypertension both of which have been well controlled.  Past Medical History:  Diagnosis Date  . AAA (abdominal aortic aneurysm) ([place])   . Arthritis   . Hypertension    Past Surgical History:  Procedure Laterality Date  . FINGER ARTHROPLASTY Right 10/18/2015   Procedure: SILASTIC  ARTHROPLASTY RIGHT MIDDLE FINGER RECONSTRUCTION COLLATERAL LIGAMENT;  Surgeon: Daryll Brod, MD;  Location: [facility];  Service: Orthopedics;  Laterality: Right;  . HAND SURGERY    . JOINT REPLACEMENT     bl knees  . WISDOM TOOTH EXTRACTION      No family history on file.  SOCIAL HISTORY: Social History   Social History  . Marital status: Married    Spouse name: N/A  . Number of children: N/A  . Years of education: N/A   Occupational History  . Not on file.   Social History Main Topics  . Smoking status: Never Smoker  . Smokeless tobacco: Never Used  . Alcohol use Yes     Comment: occas  . Drug use: No  . Sexual activity: Not on file   Other Topics Concern  . Not on file   Social History Narrative  . No narrative on file    No Known Allergies  Current Outpatient Prescriptions  Medication Sig Dispense Refill  . amlodipine-atorvastatin (CADUET) 10-20 MG tablet Take 1 tablet by mouth daily.    Marland Kitchen losartan (COZAAR) 100 MG tablet Take 100 mg by mouth daily.    . metoprolol succinate (TOPROL XL) 50 MG 24 hr tablet Take 50 mg by mouth daily. Take with or immediately following a meal.    . oxyCODONE-acetaminophen  (PERCOCET) 7.5-325 MG tablet Take 1 tablet by mouth every 4 (four) hours as needed for severe pain. (Patient not taking: Reported on 12/24/2016) 30 tablet 0   No current facility-administered medications for this visit.     ROS:   General:  No weight loss, Fever, chills  HEENT: No recent headaches, no nasal bleeding, no visual changes, no sore throat  Neurologic: No dizziness, blackouts, seizures. No recent symptoms of stroke or mini- stroke. No recent episodes of slurred speech, or temporary blindness.  Cardiac: No recent episodes of chest pain/pressure, no shortness of breath at rest.  No shortness of breath with exertion.  Denies history of atrial fibrillation or irregular heartbeat  Vascular: No history of rest pain in feet.  No history of claudication.  No history of non-healing ulcer, No history of DVT   Pulmonary: No home oxygen, no productive cough, no hemoptysis,  No asthma or wheezing  Musculoskeletal:  [X]  Arthritis, [ ]  Low back pain,  [X]  Joint pain  Hematologic:No history of hypercoagulable state.  No history of easy bleeding.  No history of anemia  Gastrointestinal: No hematochezia or melena,  No gastroesophageal reflux, no trouble swallowing  Urinary: [ ]  chronic Kidney disease, [ ]  on HD - [ ]  MWF or [ ]  TTHS, [ ]  Burning with urination, [ ]  Frequent urination, [ ]  Difficulty urinating;   Skin:  No rashes  Psychological: No history of anxiety,  No history of depression   Physical Examination  Vitals:   12/24/16 0916  BP: 128/88  Pulse: 97  Resp: 18  Temp: 98 F (36.7 C)  SpO2: 97%  Weight: 214 lb (97.1 kg)  Height: 5\' 10"  (1.778 m)    Body mass index is 30.71 kg/m.  General:  Alert and oriented, no acute distress HEENT: Normal Neck: No bruit or JVD Pulmonary: Clear to auscultation bilaterally Cardiac: Regular Rate and Rhythm without murmur Abdomen: Soft, non-tender, non-distended, no mass Skin: No rash Extremity Pulses:  2+ radial, brachial,  femoral, 2+ right popliteal, left popliteal non palpable, absent dorsalis pedis, 2+ posterior tibial pulses bilaterally Musculoskeletal: No deformity or edema  Neurologic: Upper and lower extremity motor 5/5 and symmetric  DATA:  [place] 5/18 AAA 4.6 cm  ASSESSMENT:  AAA 4.6 cm.  1.3 cm growth over 4 years.   PLAN:  F/U US 6 months.  Consider repair at 5-5.5 cm.  I discussed the pathophysiology of abdominal aortic aneurysm with the patient and his wife today. I also discussed with the patient risk of rupture of aneurysm less than 5 and half centimeters in diameter is less than 0.5% per year. I also discussed with the patient if he has severe abdominal or back pain that he should let in the emergency room physician know that he has a history of abdominal aortic aneurysm.   Ruta Hinds, MD [facility] Office: [phone] Pager: [phone]

## 2017-01-08 NOTE — Addendum Note (Signed)
Addended by: Lianne Cure A on: 01/08/2017 03:45 PM   Modules accepted: Orders

## 2017-06-25 ENCOUNTER — Ambulatory Visit (HOSPITAL_COMMUNITY)
Admission: RE | Admit: 2017-06-25 | Discharge: 2017-06-25 | Disposition: A | Discharge status: Home / Self Care | Payer: PRIVATE HEALTH INSURANCE | Source: Ambulatory Visit | Attending: Vascular Surgery | Admitting: Vascular Surgery

## 2017-06-25 ENCOUNTER — Ambulatory Visit (INDEPENDENT_AMBULATORY_CARE_PROVIDER_SITE_OTHER): Payer: PRIVATE HEALTH INSURANCE | Admitting: Vascular Surgery

## 2017-06-25 ENCOUNTER — Encounter: Payer: Self-pay | Admitting: Vascular Surgery

## 2017-06-25 VITALS — BP 140/84 | HR 83 | Temp 98.6°F | Resp 20 | Ht 70.0 in | Wt 214.9 lb

## 2017-06-25 DIAGNOSIS — I713 Abdominal aortic aneurysm, ruptured, unspecified: Secondary | ICD-10-CM

## 2017-06-25 DIAGNOSIS — I714 Abdominal aortic aneurysm, without rupture, unspecified: Secondary | ICD-10-CM

## 2017-06-25 NOTE — Progress Notes (Signed)
Patient name: Michael Osborne MRN: [id number] DOB: 1946-03-10 Sex: male   HPI: Michael Osborne is a 71 y.o. male is being followed for a small abdominal aortic aneurysm. On an ultrasound in May 2018 the aorta was noted to be 4.6 cm in diameter. The patient denies any abdominal or back pain. Other medical problems include arthritis and hypertension. These are both stable.  Past Medical History:  Diagnosis Date  . AAA (abdominal aortic aneurysm) ([place])   . Arthritis   . Hypertension    Past Surgical History:  Procedure Laterality Date  . FINGER ARTHROPLASTY Right 10/18/2015   Procedure: SILASTIC  ARTHROPLASTY RIGHT MIDDLE FINGER RECONSTRUCTION COLLATERAL LIGAMENT;  Surgeon: Daryll Brod, MD;  Location: [facility];  Service: Orthopedics;  Laterality: Right;  . HAND SURGERY    . JOINT REPLACEMENT     bl knees  . WISDOM TOOTH EXTRACTION      History reviewed. No pertinent family history.  SOCIAL HISTORY: Social History   Socioeconomic History  . Marital status: Married    Spouse name: Not on file  . Number of children: Not on file  . Years of education: Not on file  . Highest education level: Not on file  Social Needs  . Financial resource strain: Not on file  . Food insecurity - worry: Not on file  . Food insecurity - inability: Not on file  . Transportation needs - medical: Not on file  . Transportation needs - non-medical: Not on file  Occupational History  . Not on file  Tobacco Use  . Smoking status: Never Smoker  . Smokeless tobacco: Never Used  Substance and Sexual Activity  . Alcohol use: Yes    Comment: occas  . Drug use: No  . Sexual activity: Not on file  Other Topics Concern  . Not on file  Social History Narrative  . Not on file    No Known Allergies  Current Outpatient Medications  Medication Sig Dispense Refill  . amlodipine-atorvastatin (CADUET) 10-20 MG tablet Take 1 tablet by mouth daily.    Marland Kitchen losartan (COZAAR) 100 MG tablet Take 100  mg by mouth daily.    . metoprolol succinate (TOPROL XL) 50 MG 24 hr tablet Take 50 mg by mouth daily. Take with or immediately following a meal.    . oxyCODONE-acetaminophen (PERCOCET) 7.5-325 MG tablet Take 1 tablet by mouth every 4 (four) hours as needed for severe pain. (Patient not taking: Reported on 06/25/2017) 30 tablet 0   No current facility-administered medications for this visit.     ROS:   General:  No weight loss, Fever, chills  Cardiac: No recent episodes of chest pain/pressure, no shortness of breath at rest.  No shortness of breath with exertion.  Denies history of atrial fibrillation or irregular heartbeat  Vascular: No history of rest pain in feet.  No history of claudication.  No history of non-healing ulcer, No history of DVT   Pulmonary: No home oxygen, no productive cough, no hemoptysis,  No asthma or wheezing   Physical Examination  Vitals:   06/25/17 0853  BP: 140/84  Pulse: 83  Resp: 20  Temp: 98.6 F (37 C)  TempSrc: Oral  SpO2: 96%  Weight: 214 lb 14.4 oz (97.5 kg)  Height: 5\' 10"  (1.778 m)    Body mass index is 30.83 kg/m.  General:  Alert and oriented, no acute distress HEENT: Normal Neck: No bruit or JVD Pulmonary: Clear to auscultation bilaterally Cardiac: Regular Rate  and Rhythm without murmur Abdomen: Soft, non-tender, non-distended, no mass Skin: No rash Extremity Pulses:  2+ radial, brachial, femoral pulses bilaterally Musculoskeletal: No deformity or edema  Neurologic: Upper and lower extremity motor 5/5 and symmetric  DATA:  Patient had an aortic ultrasound today which showed his aortic diameter was 2.2 cm. This is a significant discrepancy from his previous ultrasound which showed aorta was 4.6 cm in diameter.  ASSESSMENT:  Patient with discrepant studies now with one ultrasound suggesting 4.67 m diameter aorta and the most recent one suggesting 2.2 cm in diameter. The patient has no family history of aortic aneurysm.  PLAN:   We will schedule the patient for CT angiogram of the chest abdomen and pelvis to definitively rule out abdominal aortic aneurysm. If he does have aneurysm we will continue to follow this every 6 months to a year. If there is no evidence of aneurysm he will probably not need any further follow-up. The patient will see me back in a few weeks after a CT scan.   Ruta Hinds, MD [facility] Office: [phone] Pager: [phone]

## 2017-07-01 ENCOUNTER — Ambulatory Visit: Payer: PRIVATE HEALTH INSURANCE | Admitting: Vascular Surgery

## 2017-07-30 ENCOUNTER — Other Ambulatory Visit: Payer: Self-pay

## 2017-07-30 ENCOUNTER — Encounter: Payer: Self-pay | Admitting: Vascular Surgery

## 2017-07-30 ENCOUNTER — Other Ambulatory Visit: Payer: Self-pay | Admitting: *Deleted

## 2017-07-30 ENCOUNTER — Other Ambulatory Visit: Payer: Self-pay | Admitting: Vascular Surgery

## 2017-07-30 ENCOUNTER — Ambulatory Visit: Payer: PRIVATE HEALTH INSURANCE | Admitting: Vascular Surgery

## 2017-07-30 ENCOUNTER — Ambulatory Visit
Admission: RE | Admit: 2017-07-30 | Discharge: 2017-07-30 | Disposition: A | Discharge status: Home / Self Care | Payer: PRIVATE HEALTH INSURANCE | Source: Ambulatory Visit | Attending: Vascular Surgery | Admitting: Vascular Surgery

## 2017-07-30 VITALS — BP 135/86 | HR 87 | Temp 97.5°F | Resp 20 | Ht 70.0 in | Wt 216.0 lb

## 2017-07-30 DIAGNOSIS — I713 Abdominal aortic aneurysm, ruptured, unspecified: Secondary | ICD-10-CM

## 2017-07-30 DIAGNOSIS — I2699 Other pulmonary embolism without acute cor pulmonale: Secondary | ICD-10-CM

## 2017-07-30 DIAGNOSIS — I723 Aneurysm of iliac artery: Secondary | ICD-10-CM

## 2017-07-30 HISTORY — DX: Aneurysm of iliac artery: I72.3

## 2017-07-30 MED ORDER — IOPAMIDOL (ISOVUE-370) INJECTION 76%
75.0000 mL | Freq: Once | INTRAVENOUS | Status: AC | PRN
  Administered 2017-07-30: 75 mL via INTRAVENOUS

## 2017-07-30 NOTE — Progress Notes (Signed)
Patient is a 72 year old male who returns for follow-up today.  He previously had 2 discrepant ultrasounds 1 suggesting 2 cm aorta and an additional 1 suggesting 4 cm aorta.  He returns today after CT angiogram of the chest abdomen and pelvis.  I was called by Dr. Anselm Pancoast from radiology earlier today stating that there was no aneurysm on the CT scan but they had concerns that he may have a right lower lobe pulmonary embolus.  The patient states he has no shortness of breath.  He has no chest pain.  He has had no episodes of hemoptysis.  He has had no long travel episodes.  He has had no leg swelling.  Social History   Socioeconomic History  . Marital status: Married    Spouse name: Not on file  . Number of children: Not on file  . Years of education: Not on file  . Highest education level: Not on file  Social Needs  . Financial resource strain: Not on file  . Food insecurity - worry: Not on file  . Food insecurity - inability: Not on file  . Transportation needs - medical: Not on file  . Transportation needs - non-medical: Not on file  Occupational History  . Not on file  Tobacco Use  . Smoking status: Never Smoker  . Smokeless tobacco: Never Used  Substance and Sexual Activity  . Alcohol use: Yes    Comment: occas  . Drug use: No  . Sexual activity: Not on file  Other Topics Concern  . Not on file  Social History Narrative  . Not on file   Current Outpatient Medications on File Prior to Visit  Medication Sig Dispense Refill  . amlodipine-atorvastatin (CADUET) 10-20 MG tablet Take 1 tablet by mouth daily.    Marland Kitchen losartan (COZAAR) 100 MG tablet Take 100 mg by mouth daily.    . metoprolol succinate (TOPROL XL) 50 MG 24 hr tablet Take 50 mg by mouth daily. Take with or immediately following a meal.    . oxyCODONE-acetaminophen (PERCOCET) 7.5-325 MG tablet Take 1 tablet by mouth every 4 (four) hours as needed for severe pain. 30 tablet 0   No current facility-administered medications on  file prior to visit.     No Known Allergies   Past Surgical History:  Procedure Laterality Date  . FINGER ARTHROPLASTY Right 10/18/2015   Procedure: SILASTIC  ARTHROPLASTY RIGHT MIDDLE FINGER RECONSTRUCTION COLLATERAL LIGAMENT;  Surgeon: Daryll Brod, MD;  Location: [facility];  Service: Orthopedics;  Laterality: Right;  . HAND SURGERY    . JOINT REPLACEMENT     bl knees  . WISDOM TOOTH EXTRACTION     Past Medical History:  Diagnosis Date  . Aneurysm of right iliac artery ([place]) 07/30/2017  . Arthritis   . Hypertension      Physical exam:  Vitals:   07/30/17 1254  BP: 135/86  Pulse: 87  Resp: 20  Temp: (!) 97.5 F (36.4 C)  TempSrc: Oral  SpO2: 96%  Weight: 216 lb (98 kg)  Height: 5\' 10"  (1.778 m)    Neck: No carotid bruits  Chest: Clear to auscultation bilaterally  Cardiac: Regular rate and rhythm without murmur  Abdomen: Soft nontender no mass  Extremities: No significant edema  Data: I reviewed the patient's CT Angio of the chest abdomen and pelvis today.  This shows irregular atherosclerosis of the abdominal and thoracic aorta but no evidence of aneurysm.  He does have a 1.9 cm aneurysm of  the right common iliac artery.  There was some suggestion of right segmental lower lobe pulmonary embolus.  Additional imaging was suggested.  Assessment: #1 no evidence of abdominal aortic aneurysm  2.  Right common iliac aneurysm 1.9 cm diameter needs a repeat ultrasound in 1 year  3.  Possible right lower lobe pulmonary embolus asymptomatic with no significant risk factors we will schedule patient for pulmonary embolus CT scan tomorrow.  I discussed the findings with Dr. hand from radiology as well as the patient's primary care physician Dr. Sharlett Iles.  We will hold off on anticoagulation until after the CT scan.  Will speak with the patient tomorrow after the CT scan results are determined.  The patient was told to consume 1 gallon of water today to hydrate  his kidneys in addition for the contrast for tomorrow.  Ruta Hinds, MD [facility] Office: [phone] Pager: [phone]

## 2017-07-31 ENCOUNTER — Ambulatory Visit
Admission: RE | Admit: 2017-07-31 | Discharge: 2017-07-31 | Disposition: A | Discharge status: Home / Self Care | Payer: PRIVATE HEALTH INSURANCE | Source: Ambulatory Visit | Attending: Vascular Surgery | Admitting: Vascular Surgery

## 2017-07-31 DIAGNOSIS — I2699 Other pulmonary embolism without acute cor pulmonale: Secondary | ICD-10-CM

## 2017-07-31 MED ORDER — IOPAMIDOL (ISOVUE-370) INJECTION 76%
75.0000 mL | Freq: Once | INTRAVENOUS | Status: AC | PRN
  Administered 2017-07-31: 75 mL via INTRAVENOUS

## 2017-11-24 ENCOUNTER — Other Ambulatory Visit: Payer: Self-pay | Admitting: Internal Medicine

## 2017-11-24 DIAGNOSIS — K30 Functional dyspepsia: Secondary | ICD-10-CM

## 2017-11-26 ENCOUNTER — Ambulatory Visit
Admission: RE | Admit: 2017-11-26 | Discharge: 2017-11-26 | Disposition: A | Discharge status: Home / Self Care | Payer: PRIVATE HEALTH INSURANCE | Source: Ambulatory Visit | Attending: Internal Medicine | Admitting: Internal Medicine

## 2017-11-26 DIAGNOSIS — K30 Functional dyspepsia: Secondary | ICD-10-CM

## 2017-12-09 ENCOUNTER — Telehealth: Payer: Self-pay | Admitting: Podiatry

## 2017-12-09 ENCOUNTER — Other Ambulatory Visit: Payer: Self-pay

## 2017-12-09 ENCOUNTER — Encounter: Payer: Self-pay | Admitting: Podiatry

## 2017-12-09 ENCOUNTER — Ambulatory Visit: Payer: PRIVATE HEALTH INSURANCE | Admitting: Podiatry

## 2017-12-09 VITALS — BP 144/86 | HR 92

## 2017-12-09 DIAGNOSIS — L600 Ingrowing nail: Secondary | ICD-10-CM | POA: Diagnosis not present

## 2017-12-09 NOTE — Progress Notes (Signed)
Subjective:   Patient ID: Michael Osborne, male   DOB: 72 y.o.   MRN: [id number]   HPI Patient presents stating his had a painful ingrown toenail of the left big toe lateral border and he tried pedicure which did not help and it is been sore and making it hard to wear shoe gear.  Patient does not smoke and likes to be active   Review of Systems  All other systems reviewed and are negative.       Objective:  Physical Exam  Constitutional: He appears well-developed and well-nourished.  Cardiovascular: Intact distal pulses.  Pulmonary/Chest: Effort normal.  Musculoskeletal: Normal range of motion.  Neurological: He is alert.  Skin: Skin is warm.  Nursing note and vitals reviewed.   Neurovascular status intact muscle strength is adequate range of motion within normal limits with patient found to have incurvated lateral border left hallux that is painful when palpated with no redness or drainage noted.  Patient is found to have good digital perfusion and is well oriented x3     Assessment:  Chronic ingrown toenail deformity left hallux lateral border that is painful when palpated     Plan:  H&P condition reviewed and recommended removal of the corner.  Patient does take Eliquis were explained it may bleed and we will put significant compression on it and he wants procedure after reading consent form and understands risk.  I infiltrated the left hallux 60 mg like Marcaine mixture with sterile prep of the toe that accomplished and using sterile instrumentation I remove the lateral border exposed matrix and applied phenol 3 applications 30 seconds followed by alcohol lavage and sterile dressing.  Instructed on soaks and reappoint

## 2017-12-09 NOTE — Telephone Encounter (Signed)
I will be seeing Dr. Paulla Dolly as a new pt today at 2 pm for an ingrown toenail. I had both knees replaced about 10 years ago. Whenever I go to the dentist I have to take an antibiotic prior to the visit. I just needed to know if I need to take an antibiotic before this appointment. I don't have any on me that I can take. If someone could please call me back at [phone].

## 2017-12-09 NOTE — Telephone Encounter (Signed)
I informed pt DR. Regal did not place pt's with implant devices on antibiotics prior to procedures as a general rule and he could mention it to him today. Pt states understanding.

## 2017-12-09 NOTE — Patient Instructions (Signed)

## 2018-08-24 DIAGNOSIS — R69 Illness, unspecified: Secondary | ICD-10-CM | POA: Diagnosis not present

## 2018-09-03 DIAGNOSIS — H2513 Age-related nuclear cataract, bilateral: Secondary | ICD-10-CM | POA: Diagnosis not present

## 2018-09-03 DIAGNOSIS — H5203 Hypermetropia, bilateral: Secondary | ICD-10-CM | POA: Diagnosis not present

## 2018-11-29 DIAGNOSIS — Z125 Encounter for screening for malignant neoplasm of prostate: Secondary | ICD-10-CM | POA: Diagnosis not present

## 2018-11-29 DIAGNOSIS — R7301 Impaired fasting glucose: Secondary | ICD-10-CM | POA: Diagnosis not present

## 2018-11-29 DIAGNOSIS — E7849 Other hyperlipidemia: Secondary | ICD-10-CM | POA: Diagnosis not present

## 2018-11-29 DIAGNOSIS — I10 Essential (primary) hypertension: Secondary | ICD-10-CM | POA: Diagnosis not present

## 2018-11-29 DIAGNOSIS — M109 Gout, unspecified: Secondary | ICD-10-CM | POA: Diagnosis not present

## 2018-11-30 DIAGNOSIS — R82998 Other abnormal findings in urine: Secondary | ICD-10-CM | POA: Diagnosis not present

## 2018-11-30 DIAGNOSIS — I10 Essential (primary) hypertension: Secondary | ICD-10-CM | POA: Diagnosis not present

## 2018-12-02 DIAGNOSIS — L812 Freckles: Secondary | ICD-10-CM | POA: Diagnosis not present

## 2018-12-02 DIAGNOSIS — L570 Actinic keratosis: Secondary | ICD-10-CM | POA: Diagnosis not present

## 2018-12-02 DIAGNOSIS — D0471 Carcinoma in situ of skin of right lower limb, including hip: Secondary | ICD-10-CM | POA: Diagnosis not present

## 2018-12-02 DIAGNOSIS — D485 Neoplasm of uncertain behavior of skin: Secondary | ICD-10-CM | POA: Diagnosis not present

## 2018-12-02 DIAGNOSIS — L821 Other seborrheic keratosis: Secondary | ICD-10-CM | POA: Diagnosis not present

## 2018-12-02 DIAGNOSIS — Z85828 Personal history of other malignant neoplasm of skin: Secondary | ICD-10-CM | POA: Diagnosis not present

## 2018-12-06 DIAGNOSIS — Z0000 Encounter for general adult medical examination without abnormal findings: Secondary | ICD-10-CM | POA: Diagnosis not present

## 2018-12-06 DIAGNOSIS — Z1331 Encounter for screening for depression: Secondary | ICD-10-CM | POA: Diagnosis not present

## 2018-12-06 DIAGNOSIS — E785 Hyperlipidemia, unspecified: Secondary | ICD-10-CM | POA: Diagnosis not present

## 2018-12-06 DIAGNOSIS — M545 Low back pain: Secondary | ICD-10-CM | POA: Diagnosis not present

## 2018-12-06 DIAGNOSIS — I2699 Other pulmonary embolism without acute cor pulmonale: Secondary | ICD-10-CM | POA: Diagnosis not present

## 2018-12-06 DIAGNOSIS — R809 Proteinuria, unspecified: Secondary | ICD-10-CM | POA: Diagnosis not present

## 2018-12-06 DIAGNOSIS — R7301 Impaired fasting glucose: Secondary | ICD-10-CM | POA: Diagnosis not present

## 2018-12-06 DIAGNOSIS — I10 Essential (primary) hypertension: Secondary | ICD-10-CM | POA: Diagnosis not present

## 2018-12-06 DIAGNOSIS — M109 Gout, unspecified: Secondary | ICD-10-CM | POA: Diagnosis not present

## 2018-12-06 DIAGNOSIS — Z8679 Personal history of other diseases of the circulatory system: Secondary | ICD-10-CM | POA: Diagnosis not present

## 2018-12-28 DIAGNOSIS — M25552 Pain in left hip: Secondary | ICD-10-CM | POA: Diagnosis not present

## 2019-02-07 DIAGNOSIS — K648 Other hemorrhoids: Secondary | ICD-10-CM | POA: Diagnosis not present

## 2019-02-07 DIAGNOSIS — K5730 Diverticulosis of large intestine without perforation or abscess without bleeding: Secondary | ICD-10-CM | POA: Diagnosis not present

## 2019-02-07 DIAGNOSIS — Z86010 Personal history of colonic polyps: Secondary | ICD-10-CM | POA: Diagnosis not present

## 2019-02-07 DIAGNOSIS — Z1211 Encounter for screening for malignant neoplasm of colon: Secondary | ICD-10-CM | POA: Diagnosis not present

## 2019-04-05 DIAGNOSIS — R69 Illness, unspecified: Secondary | ICD-10-CM | POA: Diagnosis not present

## 2019-05-30 DIAGNOSIS — L821 Other seborrheic keratosis: Secondary | ICD-10-CM | POA: Diagnosis not present

## 2019-05-30 DIAGNOSIS — Z85828 Personal history of other malignant neoplasm of skin: Secondary | ICD-10-CM | POA: Diagnosis not present

## 2019-05-30 DIAGNOSIS — L812 Freckles: Secondary | ICD-10-CM | POA: Diagnosis not present

## 2019-05-30 DIAGNOSIS — L570 Actinic keratosis: Secondary | ICD-10-CM | POA: Diagnosis not present

## 2019-08-10 ENCOUNTER — Ambulatory Visit: Payer: PRIVATE HEALTH INSURANCE | Attending: Internal Medicine

## 2019-08-10 DIAGNOSIS — Z23 Encounter for immunization: Secondary | ICD-10-CM | POA: Insufficient documentation

## 2019-08-10 NOTE — Progress Notes (Signed)
   Covid-19 Vaccination Clinic  Name:  DETWAN WANT    MRN: [id number] DOB: 03-22-1946  08/10/2019  Mr. Hagemeister was observed post Covid-19 immunization for 15 minutes without incidence. He was provided with Vaccine Information Sheet and instruction to access the V-Safe system.   Mr. Bassani was instructed to call 911 with any severe reactions post vaccine: Marland Kitchen Difficulty breathing  . Swelling of your face and throat  . A fast heartbeat  . A bad rash all over your body  . Dizziness and weakness

## 2019-08-25 DIAGNOSIS — R69 Illness, unspecified: Secondary | ICD-10-CM | POA: Diagnosis not present

## 2019-08-31 ENCOUNTER — Ambulatory Visit: Payer: PRIVATE HEALTH INSURANCE | Attending: Internal Medicine

## 2019-08-31 DIAGNOSIS — Z23 Encounter for immunization: Secondary | ICD-10-CM | POA: Insufficient documentation

## 2019-08-31 NOTE — Progress Notes (Signed)
   Covid-19 Vaccination Clinic  Name:  Michael Osborne    MRN: [id number] DOB: [birth year]  08/31/2019  Michael Osborne was observed post Covid-19 immunization for 15 minutes without incidence. He was provided with Vaccine Information Sheet and instruction to access the V-Safe system.   Michael Osborne was instructed to call 911 with any severe reactions post vaccine: Marland Kitchen Difficulty breathing  . Swelling of your face and throat  . A fast heartbeat  . A bad rash all over your body  . Dizziness and weakness    Immunizations Administered    Name Date Dose VIS Date Route   Pfizer COVID-19 Vaccine 08/31/2019 10:43 AM 0.3 mL 07/01/2019 Intramuscular   Manufacturer: [place]   Lot: [id number]   [place]: [id number]

## 2019-09-07 DIAGNOSIS — H5203 Hypermetropia, bilateral: Secondary | ICD-10-CM | POA: Diagnosis not present

## 2019-09-07 DIAGNOSIS — H2513 Age-related nuclear cataract, bilateral: Secondary | ICD-10-CM | POA: Diagnosis not present

## 2019-10-13 DIAGNOSIS — H5709 Other anomalies of pupillary function: Secondary | ICD-10-CM | POA: Diagnosis not present

## 2019-10-13 DIAGNOSIS — H25812 Combined forms of age-related cataract, left eye: Secondary | ICD-10-CM | POA: Diagnosis not present

## 2019-10-13 DIAGNOSIS — H2512 Age-related nuclear cataract, left eye: Secondary | ICD-10-CM | POA: Diagnosis not present

## 2019-11-07 DIAGNOSIS — H25811 Combined forms of age-related cataract, right eye: Secondary | ICD-10-CM | POA: Diagnosis not present

## 2019-11-07 DIAGNOSIS — H21561 Pupillary abnormality, right eye: Secondary | ICD-10-CM | POA: Diagnosis not present

## 2019-11-07 DIAGNOSIS — H2511 Age-related nuclear cataract, right eye: Secondary | ICD-10-CM | POA: Diagnosis not present

## 2019-11-20 DIAGNOSIS — R69 Illness, unspecified: Secondary | ICD-10-CM | POA: Diagnosis not present

## 2019-11-29 DIAGNOSIS — L814 Other melanin hyperpigmentation: Secondary | ICD-10-CM | POA: Diagnosis not present

## 2019-11-29 DIAGNOSIS — L821 Other seborrheic keratosis: Secondary | ICD-10-CM | POA: Diagnosis not present

## 2019-11-29 DIAGNOSIS — L570 Actinic keratosis: Secondary | ICD-10-CM | POA: Diagnosis not present

## 2019-11-29 DIAGNOSIS — Z85828 Personal history of other malignant neoplasm of skin: Secondary | ICD-10-CM | POA: Diagnosis not present

## 2019-11-30 DIAGNOSIS — M109 Gout, unspecified: Secondary | ICD-10-CM | POA: Diagnosis not present

## 2019-11-30 DIAGNOSIS — E7849 Other hyperlipidemia: Secondary | ICD-10-CM | POA: Diagnosis not present

## 2019-11-30 DIAGNOSIS — Z125 Encounter for screening for malignant neoplasm of prostate: Secondary | ICD-10-CM | POA: Diagnosis not present

## 2019-11-30 DIAGNOSIS — I10 Essential (primary) hypertension: Secondary | ICD-10-CM | POA: Diagnosis not present

## 2019-12-08 DIAGNOSIS — R7301 Impaired fasting glucose: Secondary | ICD-10-CM | POA: Diagnosis not present

## 2019-12-08 DIAGNOSIS — M109 Gout, unspecified: Secondary | ICD-10-CM | POA: Diagnosis not present

## 2019-12-08 DIAGNOSIS — Z86711 Personal history of pulmonary embolism: Secondary | ICD-10-CM | POA: Diagnosis not present

## 2019-12-08 DIAGNOSIS — Z1331 Encounter for screening for depression: Secondary | ICD-10-CM | POA: Diagnosis not present

## 2019-12-08 DIAGNOSIS — I714 Abdominal aortic aneurysm, without rupture: Secondary | ICD-10-CM | POA: Diagnosis not present

## 2019-12-08 DIAGNOSIS — M545 Low back pain: Secondary | ICD-10-CM | POA: Diagnosis not present

## 2019-12-08 DIAGNOSIS — I10 Essential (primary) hypertension: Secondary | ICD-10-CM | POA: Diagnosis not present

## 2019-12-08 DIAGNOSIS — K649 Unspecified hemorrhoids: Secondary | ICD-10-CM | POA: Diagnosis not present

## 2019-12-08 DIAGNOSIS — E785 Hyperlipidemia, unspecified: Secondary | ICD-10-CM | POA: Diagnosis not present

## 2019-12-08 DIAGNOSIS — Z0000 Encounter for general adult medical examination without abnormal findings: Secondary | ICD-10-CM | POA: Diagnosis not present

## 2019-12-08 DIAGNOSIS — R82998 Other abnormal findings in urine: Secondary | ICD-10-CM | POA: Diagnosis not present

## 2019-12-14 DIAGNOSIS — Z1212 Encounter for screening for malignant neoplasm of rectum: Secondary | ICD-10-CM | POA: Diagnosis not present

## 2020-03-29 DIAGNOSIS — R69 Illness, unspecified: Secondary | ICD-10-CM | POA: Diagnosis not present

## 2020-05-31 DIAGNOSIS — L812 Freckles: Secondary | ICD-10-CM | POA: Diagnosis not present

## 2020-05-31 DIAGNOSIS — L570 Actinic keratosis: Secondary | ICD-10-CM | POA: Diagnosis not present

## 2020-05-31 DIAGNOSIS — Z85828 Personal history of other malignant neoplasm of skin: Secondary | ICD-10-CM | POA: Diagnosis not present

## 2020-05-31 DIAGNOSIS — L821 Other seborrheic keratosis: Secondary | ICD-10-CM | POA: Diagnosis not present

## 2020-11-15 DIAGNOSIS — Z23 Encounter for immunization: Secondary | ICD-10-CM | POA: Diagnosis not present

## 2020-11-28 DIAGNOSIS — L812 Freckles: Secondary | ICD-10-CM | POA: Diagnosis not present

## 2020-11-28 DIAGNOSIS — Z85828 Personal history of other malignant neoplasm of skin: Secondary | ICD-10-CM | POA: Diagnosis not present

## 2020-11-28 DIAGNOSIS — L570 Actinic keratosis: Secondary | ICD-10-CM | POA: Diagnosis not present

## 2020-11-28 DIAGNOSIS — L821 Other seborrheic keratosis: Secondary | ICD-10-CM | POA: Diagnosis not present

## 2020-11-28 DIAGNOSIS — L218 Other seborrheic dermatitis: Secondary | ICD-10-CM | POA: Diagnosis not present

## 2020-11-28 DIAGNOSIS — L578 Other skin changes due to chronic exposure to nonionizing radiation: Secondary | ICD-10-CM | POA: Diagnosis not present

## 2020-12-05 DIAGNOSIS — H524 Presbyopia: Secondary | ICD-10-CM | POA: Diagnosis not present

## 2020-12-05 DIAGNOSIS — Z961 Presence of intraocular lens: Secondary | ICD-10-CM | POA: Diagnosis not present

## 2020-12-07 DIAGNOSIS — E785 Hyperlipidemia, unspecified: Secondary | ICD-10-CM | POA: Diagnosis not present

## 2020-12-07 DIAGNOSIS — Z125 Encounter for screening for malignant neoplasm of prostate: Secondary | ICD-10-CM | POA: Diagnosis not present

## 2020-12-07 DIAGNOSIS — M109 Gout, unspecified: Secondary | ICD-10-CM | POA: Diagnosis not present

## 2020-12-14 DIAGNOSIS — Z1339 Encounter for screening examination for other mental health and behavioral disorders: Secondary | ICD-10-CM | POA: Diagnosis not present

## 2020-12-14 DIAGNOSIS — E785 Hyperlipidemia, unspecified: Secondary | ICD-10-CM | POA: Diagnosis not present

## 2020-12-14 DIAGNOSIS — Z1212 Encounter for screening for malignant neoplasm of rectum: Secondary | ICD-10-CM | POA: Diagnosis not present

## 2020-12-14 DIAGNOSIS — I700 Atherosclerosis of aorta: Secondary | ICD-10-CM | POA: Diagnosis not present

## 2020-12-14 DIAGNOSIS — M109 Gout, unspecified: Secondary | ICD-10-CM | POA: Diagnosis not present

## 2020-12-14 DIAGNOSIS — R7301 Impaired fasting glucose: Secondary | ICD-10-CM | POA: Diagnosis not present

## 2020-12-14 DIAGNOSIS — Z0000 Encounter for general adult medical examination without abnormal findings: Secondary | ICD-10-CM | POA: Diagnosis not present

## 2020-12-14 DIAGNOSIS — I723 Aneurysm of iliac artery: Secondary | ICD-10-CM | POA: Diagnosis not present

## 2020-12-14 DIAGNOSIS — I10 Essential (primary) hypertension: Secondary | ICD-10-CM | POA: Diagnosis not present

## 2020-12-14 DIAGNOSIS — R82998 Other abnormal findings in urine: Secondary | ICD-10-CM | POA: Diagnosis not present

## 2020-12-14 DIAGNOSIS — Z1331 Encounter for screening for depression: Secondary | ICD-10-CM | POA: Diagnosis not present

## 2020-12-14 DIAGNOSIS — I714 Abdominal aortic aneurysm, without rupture: Secondary | ICD-10-CM | POA: Diagnosis not present

## 2020-12-14 DIAGNOSIS — Z86711 Personal history of pulmonary embolism: Secondary | ICD-10-CM | POA: Diagnosis not present

## 2020-12-21 ENCOUNTER — Other Ambulatory Visit: Payer: Self-pay | Admitting: *Deleted

## 2020-12-21 DIAGNOSIS — I714 Abdominal aortic aneurysm, without rupture, unspecified: Secondary | ICD-10-CM

## 2021-01-03 ENCOUNTER — Other Ambulatory Visit: Payer: Self-pay

## 2021-01-03 ENCOUNTER — Ambulatory Visit (HOSPITAL_COMMUNITY)
Admission: RE | Admit: 2021-01-03 | Discharge: 2021-01-03 | Disposition: A | Discharge status: Home / Self Care | Payer: Medicare HMO | Source: Ambulatory Visit | Attending: Vascular Surgery | Admitting: Vascular Surgery

## 2021-01-03 ENCOUNTER — Encounter: Payer: Self-pay | Admitting: Vascular Surgery

## 2021-01-03 ENCOUNTER — Ambulatory Visit (INDEPENDENT_AMBULATORY_CARE_PROVIDER_SITE_OTHER): Payer: Medicare HMO | Admitting: Vascular Surgery

## 2021-01-03 VITALS — BP 128/81 | HR 58 | Temp 98.4°F | Resp 20 | Ht 70.0 in | Wt 214.0 lb

## 2021-01-03 DIAGNOSIS — I714 Abdominal aortic aneurysm, without rupture, unspecified: Secondary | ICD-10-CM

## 2021-01-03 DIAGNOSIS — I723 Aneurysm of iliac artery: Secondary | ICD-10-CM | POA: Diagnosis not present

## 2021-01-03 NOTE — Progress Notes (Signed)
Patient is a 75 year old male who returns for follow-up today.  He was last seen in 2019.  We are following him for a small right common iliac aneurysm.  It was 1.9 cm in diameter in 2019 by CT angio.  He reports no abdominal chest or back pain.  Overall he feels well.  He has no symptoms of claudication in his lower extremities.  He has no difficulty walking.  He has no shortness of breath.  He has no chest pain.  He has no family history of aneurysm.  He is a non-smoker.  Past Medical History:  Diagnosis Date   Aneurysm of right iliac artery ([place]) 07/30/2017   Arthritis    Hypertension     Past Surgical History:  Procedure Laterality Date   FINGER ARTHROPLASTY Right 10/18/2015   Procedure: SILASTIC  ARTHROPLASTY RIGHT MIDDLE FINGER RECONSTRUCTION COLLATERAL LIGAMENT;  Surgeon: Daryll Brod, MD;  Location: [facility];  Service: Orthopedics;  Laterality: Right;   HAND SURGERY     JOINT REPLACEMENT     bl knees   WISDOM TOOTH EXTRACTION      Current Outpatient Medications on File Prior to Visit  Medication Sig Dispense Refill   allopurinol (ZYLOPRIM) 300 MG tablet TK 1 T PO ONCE D TO PREVENT GOUT FLAREUPS  2   amlodipine-atorvastatin (CADUET) 10-20 MG tablet Take 1 tablet by mouth daily.     ELIQUIS 5 MG TABS tablet TK 1 T PO BID  5   losartan (COZAAR) 100 MG tablet Take 100 mg by mouth daily.     metoprolol succinate (TOPROL-XL) 50 MG 24 hr tablet Take 50 mg by mouth daily. Take with or immediately following a meal.     No current facility-administered medications on file prior to visit.    Social History   Socioeconomic History   Marital status: Married    Spouse name: Not on file   Number of children: Not on file   Years of education: Not on file   Highest education level: Not on file  Occupational History   Not on file  Tobacco Use   Smoking status: Never   Smokeless tobacco: Never  Vaping Use   Vaping Use: Never used  Substance and Sexual Activity    Alcohol use: Yes    Comment: occas   Drug use: No   Sexual activity: Not on file  Other Topics Concern   Not on file  Social History Narrative   Not on file   Social Determinants of Health   Financial Resource Strain: Not on file  Food Insecurity: Not on file  Transportation Needs: Not on file  Physical Activity: Not on file  Stress: Not on file  Social Connections: Not on file  Intimate Partner Violence: Not on file   Physical exam:  Vitals:   01/03/21 0826  BP: 128/81  Pulse: (!) 58  Resp: 20  Temp: 98.4 F (36.9 C)  SpO2: 95%  Weight: 214 lb (97.1 kg)  Height: 5\' 10"  (1.778 m)    Abdomen: Soft nontender nondistended no mass  Extremities: 2+ femoral 2+ posterior tibial pulses absent right dorsalis pedis 1+ left dorsalis pedis pulse  Data: Patient had duplex ultrasound today showed no evidence of abdominal aortic aneurysm.  Right common iliac measures 1.2 cm in diameter today.  Assessment: Patient with known 1.9 cm right common iliac aneurysm.  Discrepancy in size from ultrasound today.  Plan: We will obtain a CT angiogram abdomen and pelvis for more accurate  measurement of the right common iliac aneurysm.  This will be done within the next 1 to 2 weeks and I will discuss the results with him for a virtual visit.  If there is no significant change in size over the last 3 years we will probably rescan him once every 3 to 5 years.  Ruta Hinds, MD [facility] Office: [phone]

## 2021-01-22 ENCOUNTER — Other Ambulatory Visit: Payer: Self-pay

## 2021-01-22 ENCOUNTER — Ambulatory Visit
Admission: RE | Admit: 2021-01-22 | Discharge: 2021-01-22 | Disposition: A | Discharge status: Home / Self Care | Payer: Medicare HMO | Source: Ambulatory Visit | Attending: Vascular Surgery | Admitting: Vascular Surgery

## 2021-01-22 DIAGNOSIS — I714 Abdominal aortic aneurysm, without rupture, unspecified: Secondary | ICD-10-CM

## 2021-01-22 DIAGNOSIS — K449 Diaphragmatic hernia without obstruction or gangrene: Secondary | ICD-10-CM | POA: Diagnosis not present

## 2021-01-22 DIAGNOSIS — I745 Embolism and thrombosis of iliac artery: Secondary | ICD-10-CM | POA: Diagnosis not present

## 2021-01-22 DIAGNOSIS — I701 Atherosclerosis of renal artery: Secondary | ICD-10-CM | POA: Diagnosis not present

## 2021-01-22 DIAGNOSIS — I723 Aneurysm of iliac artery: Secondary | ICD-10-CM

## 2021-01-22 MED ORDER — IOPAMIDOL (ISOVUE-370) INJECTION 76%
75.0000 mL | Freq: Once | INTRAVENOUS | Status: AC | PRN
  Administered 2021-01-22: 75 mL via INTRAVENOUS

## 2021-01-24 ENCOUNTER — Other Ambulatory Visit: Payer: Self-pay

## 2021-01-24 DIAGNOSIS — I714 Abdominal aortic aneurysm, without rupture, unspecified: Secondary | ICD-10-CM

## 2021-01-31 ENCOUNTER — Encounter: Payer: Self-pay | Admitting: Vascular Surgery

## 2021-01-31 ENCOUNTER — Other Ambulatory Visit: Payer: Self-pay

## 2021-01-31 ENCOUNTER — Ambulatory Visit (INDEPENDENT_AMBULATORY_CARE_PROVIDER_SITE_OTHER): Payer: Medicare HMO | Admitting: Vascular Surgery

## 2021-01-31 DIAGNOSIS — I723 Aneurysm of iliac artery: Secondary | ICD-10-CM | POA: Diagnosis not present

## 2021-01-31 NOTE — Progress Notes (Signed)
Virtual Visit via Telephone Note  I connected with Michael Osborne on 01/31/2021 using the [email or web address] by telephone and verified that I was speaking with the correct person using two identifiers. Patient was located at home and accompanied by no one. I am located at our office on [facility].   The limitations of evaluation and management by telemedicine and the availability of in person appointments have been previously discussed with the patient and are documented in the patients chart. The patient expressed understanding and consented to proceed.  PCP: Leanna Battles, MD  History of Present Illness:  Patient is a 75 year old male who returns for follow-up today.  He was last seen June 2022.  He is being followed for a small right common iliac aneurysm.  It was 1.9 cm in diameter in 2019.  Phone visit today after recent CT scan.  He has had no new medical problems since his last office visit.  He has no claudication symptoms.  He states he did have a sore area in his groin after swinging a golf club a few days ago but thinks this is getting better.  Past Medical History:  Diagnosis Date   Aneurysm of right iliac artery ([place]) 07/30/2017   Arthritis    Hypertension     Past Surgical History:  Procedure Laterality Date   FINGER ARTHROPLASTY Right 10/18/2015   Procedure: SILASTIC  ARTHROPLASTY RIGHT MIDDLE FINGER RECONSTRUCTION COLLATERAL LIGAMENT;  Surgeon: Daryll Brod, MD;  Location: [facility];  Service: Orthopedics;  Laterality: Right;   HAND SURGERY     JOINT REPLACEMENT     bl knees   WISDOM TOOTH EXTRACTION      No outpatient medications have been marked as taking for the 01/31/21 encounter (Appointment) with Elam Dutch, MD.   Current Outpatient Medications on File Prior to Visit  Medication Sig Dispense Refill   allopurinol (ZYLOPRIM) 300 MG tablet TK 1 T PO ONCE D TO PREVENT GOUT FLAREUPS  2   amlodipine-atorvastatin (CADUET) 10-20 MG tablet Take 1 tablet  by mouth daily.     ELIQUIS 5 MG TABS tablet TK 1 T PO BID  5   losartan (COZAAR) 100 MG tablet Take 100 mg by mouth daily.     metoprolol succinate (TOPROL-XL) 50 MG 24 hr tablet Take 50 mg by mouth daily. Take with or immediately following a meal.     No current facility-administered medications on file prior to visit.      Observations/Objective: I reviewed the patient's CT scan dated January 22, 2021.  I reviewed and interpreted these images.  Patient had a normal aortic diameter.  There was a 50% superior mesenteric artery stenosis no significant celiac artery stenosis.  There was a 50% left renal artery stenosis.  Right common iliac artery measures 1.9 cm.  There was a 50% left common iliac stenosis.  Assessment and Plan:  1.9 cm right common iliac artery aneurysm essentially unchanged over the last several years dating back at least to 2019.  Infrarenal aortic diameter was 2.5 cm.  No evidence of aortic aneurysm.  Patient will be scheduled for a follow-up CT scan of the abdomen and pelvis in 2 years and be seen in our APP clinic.   I discussed the assessment and treatment plan with the patient. The patient was provided an opportunity to ask questions and all were answered. The patient agreed with the plan and demonstrated an understanding of the instructions.   The patient was advised  to call back or seek an in-person evaluation if the symptoms worsen or if the condition fails to improve as anticipated.  I spent 10 minutes with the patient via telephone encounter.   Signed, Ruta Hinds [facility] [place] Office: [phone]  01/31/2021, 11:00 AM

## 2021-06-03 DIAGNOSIS — Z85828 Personal history of other malignant neoplasm of skin: Secondary | ICD-10-CM | POA: Diagnosis not present

## 2021-06-03 DIAGNOSIS — L570 Actinic keratosis: Secondary | ICD-10-CM | POA: Diagnosis not present

## 2021-06-03 DIAGNOSIS — L821 Other seborrheic keratosis: Secondary | ICD-10-CM | POA: Diagnosis not present

## 2021-06-03 DIAGNOSIS — L812 Freckles: Secondary | ICD-10-CM | POA: Diagnosis not present

## 2021-06-03 DIAGNOSIS — D485 Neoplasm of uncertain behavior of skin: Secondary | ICD-10-CM | POA: Diagnosis not present

## 2021-11-12 DIAGNOSIS — M722 Plantar fascial fibromatosis: Secondary | ICD-10-CM | POA: Diagnosis not present

## 2021-12-04 DIAGNOSIS — L821 Other seborrheic keratosis: Secondary | ICD-10-CM | POA: Diagnosis not present

## 2021-12-04 DIAGNOSIS — L570 Actinic keratosis: Secondary | ICD-10-CM | POA: Diagnosis not present

## 2021-12-04 DIAGNOSIS — L218 Other seborrheic dermatitis: Secondary | ICD-10-CM | POA: Diagnosis not present

## 2021-12-04 DIAGNOSIS — Z85828 Personal history of other malignant neoplasm of skin: Secondary | ICD-10-CM | POA: Diagnosis not present

## 2021-12-04 DIAGNOSIS — D2262 Melanocytic nevi of left upper limb, including shoulder: Secondary | ICD-10-CM | POA: Diagnosis not present

## 2021-12-10 DIAGNOSIS — H524 Presbyopia: Secondary | ICD-10-CM | POA: Diagnosis not present

## 2021-12-10 DIAGNOSIS — Z961 Presence of intraocular lens: Secondary | ICD-10-CM | POA: Diagnosis not present

## 2022-01-27 DIAGNOSIS — Z125 Encounter for screening for malignant neoplasm of prostate: Secondary | ICD-10-CM | POA: Diagnosis not present

## 2022-01-27 DIAGNOSIS — E785 Hyperlipidemia, unspecified: Secondary | ICD-10-CM | POA: Diagnosis not present

## 2022-01-27 DIAGNOSIS — Z79899 Other long term (current) drug therapy: Secondary | ICD-10-CM | POA: Diagnosis not present

## 2022-01-27 DIAGNOSIS — M109 Gout, unspecified: Secondary | ICD-10-CM | POA: Diagnosis not present

## 2022-01-27 DIAGNOSIS — R7989 Other specified abnormal findings of blood chemistry: Secondary | ICD-10-CM | POA: Diagnosis not present

## 2022-01-27 DIAGNOSIS — R7301 Impaired fasting glucose: Secondary | ICD-10-CM | POA: Diagnosis not present

## 2022-02-03 DIAGNOSIS — Z86711 Personal history of pulmonary embolism: Secondary | ICD-10-CM | POA: Diagnosis not present

## 2022-02-03 DIAGNOSIS — E785 Hyperlipidemia, unspecified: Secondary | ICD-10-CM | POA: Diagnosis not present

## 2022-02-03 DIAGNOSIS — Z1212 Encounter for screening for malignant neoplasm of rectum: Secondary | ICD-10-CM | POA: Diagnosis not present

## 2022-02-03 DIAGNOSIS — R82998 Other abnormal findings in urine: Secondary | ICD-10-CM | POA: Diagnosis not present

## 2022-02-03 DIAGNOSIS — I700 Atherosclerosis of aorta: Secondary | ICD-10-CM | POA: Diagnosis not present

## 2022-02-03 DIAGNOSIS — I10 Essential (primary) hypertension: Secondary | ICD-10-CM | POA: Diagnosis not present

## 2022-02-03 DIAGNOSIS — Z1339 Encounter for screening examination for other mental health and behavioral disorders: Secondary | ICD-10-CM | POA: Diagnosis not present

## 2022-02-03 DIAGNOSIS — Z1331 Encounter for screening for depression: Secondary | ICD-10-CM | POA: Diagnosis not present

## 2022-02-03 DIAGNOSIS — R7301 Impaired fasting glucose: Secondary | ICD-10-CM | POA: Diagnosis not present

## 2022-02-03 DIAGNOSIS — M109 Gout, unspecified: Secondary | ICD-10-CM | POA: Diagnosis not present

## 2022-02-03 DIAGNOSIS — Z0000 Encounter for general adult medical examination without abnormal findings: Secondary | ICD-10-CM | POA: Diagnosis not present

## 2022-06-09 DIAGNOSIS — D692 Other nonthrombocytopenic purpura: Secondary | ICD-10-CM | POA: Diagnosis not present

## 2022-06-09 DIAGNOSIS — Z85828 Personal history of other malignant neoplasm of skin: Secondary | ICD-10-CM | POA: Diagnosis not present

## 2022-06-09 DIAGNOSIS — L821 Other seborrheic keratosis: Secondary | ICD-10-CM | POA: Diagnosis not present

## 2022-06-09 DIAGNOSIS — L812 Freckles: Secondary | ICD-10-CM | POA: Diagnosis not present

## 2022-06-09 DIAGNOSIS — L570 Actinic keratosis: Secondary | ICD-10-CM | POA: Diagnosis not present

## 2022-12-11 DIAGNOSIS — Z85828 Personal history of other malignant neoplasm of skin: Secondary | ICD-10-CM | POA: Diagnosis not present

## 2022-12-11 DIAGNOSIS — L570 Actinic keratosis: Secondary | ICD-10-CM | POA: Diagnosis not present

## 2022-12-11 DIAGNOSIS — D485 Neoplasm of uncertain behavior of skin: Secondary | ICD-10-CM | POA: Diagnosis not present

## 2022-12-11 DIAGNOSIS — C44311 Basal cell carcinoma of skin of nose: Secondary | ICD-10-CM | POA: Diagnosis not present

## 2022-12-11 DIAGNOSIS — D692 Other nonthrombocytopenic purpura: Secondary | ICD-10-CM | POA: Diagnosis not present

## 2022-12-11 DIAGNOSIS — L812 Freckles: Secondary | ICD-10-CM | POA: Diagnosis not present

## 2022-12-11 DIAGNOSIS — L821 Other seborrheic keratosis: Secondary | ICD-10-CM | POA: Diagnosis not present

## 2022-12-16 DIAGNOSIS — Z961 Presence of intraocular lens: Secondary | ICD-10-CM | POA: Diagnosis not present

## 2022-12-16 DIAGNOSIS — H01002 Unspecified blepharitis right lower eyelid: Secondary | ICD-10-CM | POA: Diagnosis not present

## 2023-01-27 DIAGNOSIS — C44311 Basal cell carcinoma of skin of nose: Secondary | ICD-10-CM | POA: Diagnosis not present

## 2023-04-14 DIAGNOSIS — Z125 Encounter for screening for malignant neoplasm of prostate: Secondary | ICD-10-CM | POA: Diagnosis not present

## 2023-04-14 DIAGNOSIS — I10 Essential (primary) hypertension: Secondary | ICD-10-CM | POA: Diagnosis not present

## 2023-04-14 DIAGNOSIS — E785 Hyperlipidemia, unspecified: Secondary | ICD-10-CM | POA: Diagnosis not present

## 2023-04-14 DIAGNOSIS — Z1389 Encounter for screening for other disorder: Secondary | ICD-10-CM | POA: Diagnosis not present

## 2023-04-14 DIAGNOSIS — Z1212 Encounter for screening for malignant neoplasm of rectum: Secondary | ICD-10-CM | POA: Diagnosis not present

## 2023-04-14 DIAGNOSIS — M109 Gout, unspecified: Secondary | ICD-10-CM | POA: Diagnosis not present

## 2023-04-21 DIAGNOSIS — Z1212 Encounter for screening for malignant neoplasm of rectum: Secondary | ICD-10-CM | POA: Diagnosis not present

## 2023-04-21 DIAGNOSIS — R82998 Other abnormal findings in urine: Secondary | ICD-10-CM | POA: Diagnosis not present

## 2023-04-21 DIAGNOSIS — M109 Gout, unspecified: Secondary | ICD-10-CM | POA: Diagnosis not present

## 2023-04-21 DIAGNOSIS — I700 Atherosclerosis of aorta: Secondary | ICD-10-CM | POA: Diagnosis not present

## 2023-04-21 DIAGNOSIS — Z0000 Encounter for general adult medical examination without abnormal findings: Secondary | ICD-10-CM | POA: Diagnosis not present

## 2023-04-21 DIAGNOSIS — Z23 Encounter for immunization: Secondary | ICD-10-CM | POA: Diagnosis not present

## 2023-04-21 DIAGNOSIS — I10 Essential (primary) hypertension: Secondary | ICD-10-CM | POA: Diagnosis not present

## 2023-04-21 DIAGNOSIS — E785 Hyperlipidemia, unspecified: Secondary | ICD-10-CM | POA: Diagnosis not present

## 2023-04-21 DIAGNOSIS — Z1339 Encounter for screening examination for other mental health and behavioral disorders: Secondary | ICD-10-CM | POA: Diagnosis not present

## 2023-04-21 DIAGNOSIS — Z1331 Encounter for screening for depression: Secondary | ICD-10-CM | POA: Diagnosis not present

## 2023-06-11 DIAGNOSIS — L821 Other seborrheic keratosis: Secondary | ICD-10-CM | POA: Diagnosis not present

## 2023-06-11 DIAGNOSIS — L570 Actinic keratosis: Secondary | ICD-10-CM | POA: Diagnosis not present

## 2023-06-11 DIAGNOSIS — L812 Freckles: Secondary | ICD-10-CM | POA: Diagnosis not present

## 2023-06-11 DIAGNOSIS — L853 Xerosis cutis: Secondary | ICD-10-CM | POA: Diagnosis not present

## 2023-06-11 DIAGNOSIS — Z85828 Personal history of other malignant neoplasm of skin: Secondary | ICD-10-CM | POA: Diagnosis not present

## 2023-06-11 DIAGNOSIS — D692 Other nonthrombocytopenic purpura: Secondary | ICD-10-CM | POA: Diagnosis not present

## 2023-12-10 DIAGNOSIS — L812 Freckles: Secondary | ICD-10-CM | POA: Diagnosis not present

## 2023-12-10 DIAGNOSIS — L820 Inflamed seborrheic keratosis: Secondary | ICD-10-CM | POA: Diagnosis not present

## 2023-12-10 DIAGNOSIS — L821 Other seborrheic keratosis: Secondary | ICD-10-CM | POA: Diagnosis not present

## 2023-12-10 DIAGNOSIS — Z85828 Personal history of other malignant neoplasm of skin: Secondary | ICD-10-CM | POA: Diagnosis not present

## 2023-12-10 DIAGNOSIS — L570 Actinic keratosis: Secondary | ICD-10-CM | POA: Diagnosis not present

## 2023-12-22 DIAGNOSIS — H02834 Dermatochalasis of left upper eyelid: Secondary | ICD-10-CM | POA: Diagnosis not present

## 2023-12-22 DIAGNOSIS — H11153 Pinguecula, bilateral: Secondary | ICD-10-CM | POA: Diagnosis not present

## 2023-12-22 DIAGNOSIS — H02831 Dermatochalasis of right upper eyelid: Secondary | ICD-10-CM | POA: Diagnosis not present

## 2023-12-22 DIAGNOSIS — Z961 Presence of intraocular lens: Secondary | ICD-10-CM | POA: Diagnosis not present

## 2024-03-23 DIAGNOSIS — Z1211 Encounter for screening for malignant neoplasm of colon: Secondary | ICD-10-CM | POA: Diagnosis not present

## 2024-05-03 DIAGNOSIS — E669 Obesity, unspecified: Secondary | ICD-10-CM | POA: Diagnosis not present

## 2024-05-03 DIAGNOSIS — Z0000 Encounter for general adult medical examination without abnormal findings: Secondary | ICD-10-CM | POA: Diagnosis not present

## 2024-05-03 DIAGNOSIS — E785 Hyperlipidemia, unspecified: Secondary | ICD-10-CM | POA: Diagnosis not present

## 2024-05-03 DIAGNOSIS — F1090 Alcohol use, unspecified, uncomplicated: Secondary | ICD-10-CM | POA: Diagnosis not present

## 2024-05-03 DIAGNOSIS — Z23 Encounter for immunization: Secondary | ICD-10-CM | POA: Diagnosis not present

## 2024-05-03 DIAGNOSIS — R7301 Impaired fasting glucose: Secondary | ICD-10-CM | POA: Diagnosis not present

## 2024-05-03 DIAGNOSIS — R972 Elevated prostate specific antigen [PSA]: Secondary | ICD-10-CM | POA: Diagnosis not present

## 2024-05-03 DIAGNOSIS — R82998 Other abnormal findings in urine: Secondary | ICD-10-CM | POA: Diagnosis not present

## 2024-05-03 DIAGNOSIS — M109 Gout, unspecified: Secondary | ICD-10-CM | POA: Diagnosis not present

## 2024-05-03 DIAGNOSIS — I10 Essential (primary) hypertension: Secondary | ICD-10-CM | POA: Diagnosis not present

## 2024-06-14 DIAGNOSIS — D692 Other nonthrombocytopenic purpura: Secondary | ICD-10-CM | POA: Diagnosis not present

## 2024-06-14 DIAGNOSIS — L820 Inflamed seborrheic keratosis: Secondary | ICD-10-CM | POA: Diagnosis not present

## 2024-06-14 DIAGNOSIS — L218 Other seborrheic dermatitis: Secondary | ICD-10-CM | POA: Diagnosis not present

## 2024-06-14 DIAGNOSIS — L812 Freckles: Secondary | ICD-10-CM | POA: Diagnosis not present

## 2024-06-14 DIAGNOSIS — L570 Actinic keratosis: Secondary | ICD-10-CM | POA: Diagnosis not present

## 2024-06-14 DIAGNOSIS — L853 Xerosis cutis: Secondary | ICD-10-CM | POA: Diagnosis not present

## 2024-06-14 DIAGNOSIS — Z85828 Personal history of other malignant neoplasm of skin: Secondary | ICD-10-CM | POA: Diagnosis not present

## 2024-06-14 DIAGNOSIS — L821 Other seborrheic keratosis: Secondary | ICD-10-CM | POA: Diagnosis not present
